# Patient Record
Sex: Female | Born: 1980 | Hispanic: Yes | Marital: Single | Smoking: Never smoker
Health system: Southern US, Community
[De-identification: ages and names within clinical notes are randomized; demographics above are authoritative.]

## PROBLEM LIST (undated history)

## (undated) ENCOUNTER — Inpatient Hospital Stay (HOSPITAL_COMMUNITY): Payer: Self-pay

## (undated) DIAGNOSIS — Z8759 Personal history of other complications of pregnancy, childbirth and the puerperium: Secondary | ICD-10-CM

## (undated) DIAGNOSIS — N189 Chronic kidney disease, unspecified: Secondary | ICD-10-CM

## (undated) DIAGNOSIS — F32A Depression, unspecified: Secondary | ICD-10-CM

## (undated) DIAGNOSIS — Z87440 Personal history of urinary (tract) infections: Secondary | ICD-10-CM

## (undated) DIAGNOSIS — F329 Major depressive disorder, single episode, unspecified: Secondary | ICD-10-CM

## (undated) HISTORY — PX: BREAST SURGERY: SHX581

---

## 2005-03-25 HISTORY — PX: BREAST LUMPECTOMY: SHX2

## 2017-01-08 ENCOUNTER — Encounter (HOSPITAL_COMMUNITY): Payer: Self-pay | Admitting: *Deleted

## 2017-01-08 ENCOUNTER — Emergency Department (HOSPITAL_COMMUNITY)
Admission: EM | Admit: 2017-01-08 | Discharge: 2017-01-08 | Disposition: A | Discharge status: Home / Self Care | Payer: Self-pay | Attending: Emergency Medicine | Admitting: Emergency Medicine

## 2017-01-08 DIAGNOSIS — H9201 Otalgia, right ear: Secondary | ICD-10-CM | POA: Insufficient documentation

## 2017-01-08 DIAGNOSIS — J3489 Other specified disorders of nose and nasal sinuses: Secondary | ICD-10-CM | POA: Insufficient documentation

## 2017-01-08 MED ORDER — FLUTICASONE PROPIONATE 50 MCG/ACT NA SUSP: 2.0000 | Freq: Every day | NASAL | 0 refills | Status: DC

## 2017-01-08 MED ORDER — AMOXICILLIN-POT CLAVULANATE 875-125 MG PO TABS: 1.0000 | ORAL_TABLET | Freq: Two times a day (BID) | ORAL | 0 refills | Status: AC

## 2017-01-08 MED ORDER — AMOXICILLIN-POT CLAVULANATE 875-125 MG PO TABS
1.0000 | ORAL_TABLET | Freq: Once | ORAL | Status: AC
  Administered 2017-01-08: 1 via ORAL
  Filled 2017-01-08: qty 1

## 2017-01-08 NOTE — ED Provider Notes (Signed)
[facility] EMERGENCY DEPARTMENT Provider Note   CSN: [id number] Arrival date & time: 01/08/17  1324     History   Chief Complaint Chief Complaint  Patient presents with  . Fever  . Otalgia    HPI Melanie Warner is a 36 y.o. female who presents today with chief complaint acute onset, progressively worsening right-sided ear pain, myalgias, nasal congestion, frontal headache for 2 weeks. She states that her symptoms initially began with a sore throat which resolved, followed by right sided ear pain and aching frontal headache which is worse first thing in the mornings.also endorses subjective fevers and chills, bilateral tinnitus, and right-sided hearing loss. He denies CP, SOB, abdominal pain, nausea, or vomiting. Denies neck stiffness or pain. Has tried Advil without significant relief of her symptoms. She states that one day last week her left ear was draining yellow-red drainage. She does use Q-tips but is unsure if her symptoms began after using Q-tips recently.  The history is provided by the patient.    History reviewed. No pertinent past medical history.  There are no active problems to display for this patient.   History reviewed. No pertinent surgical history.  OB History    No data available       Home Medications    Prior to Admission medications   Medication Sig Start Date End Date Taking? Authorizing Provider  amoxicillin-clavulanate (AUGMENTIN) 875-125 MG tablet Take 1 tablet by mouth every 12 (twelve) hours. 01/08/17 01/18/17  Michela Pitcher A, PA-C  fluticasone (FLONASE) 50 MCG/ACT nasal spray Place 2 sprays into both nostrils daily. 01/08/17   Jeanie Sewer, PA-C    Family History History reviewed. No pertinent family history.  Social History Social History  Substance Use Topics  . Smoking status: Never Smoker  . Smokeless tobacco: Not on file  . Alcohol use No     Allergies   Patient has no known allergies.   Review  of Systems Review of Systems  Constitutional: Positive for chills and fever.  HENT: Positive for congestion, ear discharge, ear pain, hearing loss and sore throat. Negative for facial swelling and sneezing.   Respiratory: Negative for cough and shortness of breath.   Cardiovascular: Negative for chest pain.  Gastrointestinal: Negative for abdominal pain, nausea and vomiting.  Musculoskeletal: Negative for back pain and neck pain.  Neurological: Positive for headaches. Negative for syncope and weakness.     Physical Exam Updated Vital Signs BP 100/69 (BP Location: Left Arm)   Pulse 87   Temp 97.6 F (36.4 C) (Oral)   Resp 18   LMP 01/05/2017   SpO2 100%   Physical Exam  Constitutional: She appears well-developed and well-nourished. No distress.  HENT:  Head: Normocephalic and atraumatic.  Right Ear: External ear normal.  Left Ear: External ear normal.  No pain on touching the pinna or tragus of either ear. No mastoid tenderness bilaterally. LEft TM without erythema or bulging. Right TM with small amount of blood inferiorly behind the tympanic membrane. There appears to be a healing hematoma on the TM centrally. Nasal septum is midline with mild nasal mucosa bilaterally. No maxillary sinus TTP, bilateral frontal sinus tenderness to palpation. Posterior oropharynx without uvular deviation, tonsillar hypertrophy, erythema, or exudates. No trismus or sublingual abnormalities.  Eyes: Pupils are equal, round, and reactive to light. Conjunctivae and EOM are normal. Right eye exhibits no discharge. Left eye exhibits no discharge.  Neck: Normal range of motion. Neck supple. No JVD present. No tracheal  deviation present.  No meningeal signs  Cardiovascular: Normal rate, regular rhythm and normal heart sounds.   Pulmonary/Chest: Effort normal and breath sounds normal. No respiratory distress. She has no wheezes. She has no rales.  Abdominal: Soft. Bowel sounds are normal. She exhibits no  distension. There is no tenderness.  Musculoskeletal: She exhibits no edema.  Neurological: She is alert.  Skin: Skin is warm and dry. No erythema.  Psychiatric: She has a normal mood and affect. Her behavior is normal.  Nursing note and vitals reviewed.    ED Treatments / Results  Labs (all labs ordered are listed, but only abnormal results are displayed) Labs Reviewed - No data to display  EKG  EKG Interpretation None       Radiology No results found.  Procedures Procedures (including critical care time)  Medications Ordered in ED Medications  amoxicillin-clavulanate (AUGMENTIN) 875-125 MG per tablet 1 tablet (1 tablet Oral Given 01/08/17 1409)     Initial Impression / Assessment and Plan / ED Course  I have reviewed the triage vital signs and the nursing notes.  Pertinent labs & imaging results that were available during my care of the patient were reviewed by me and considered in my medical decision making (see chart for details).     Patient with history and physical examination suggestive of right acute otitis media versus frontal sinusitis. Afebrile, vital signs are stable while in the ED. Examination is not concerning for strep pharyngitis, PTA, soft tissue infection, Ludwig's angina, pneumonia, or other acute cardio pulmonary abnormality. No evidence of mastoiditis. She is nontoxic in appearance. Advised patient she should stop using Q-tips to clean her ears. Will discharge with Augmentin and Flonase. First dose of Augmentin given in the ED. Recommend follow-up with primary care physician in the next 3-4 days for reevaluation. Discussed indications for return to the ED. Pt verbalized understanding of and agreement with plan and is safe for discharge home at this time.   Final Clinical Impressions(s) / ED Diagnoses   Final diagnoses:  Acute ear pain, right  Frontal sinus pain    New Prescriptions Discharge Medication List as of 01/08/2017  2:02 PM    START  taking these medications   Details  amoxicillin-clavulanate (AUGMENTIN) 875-125 MG tablet Take 1 tablet by mouth every 12 (twelve) hours., Starting Wed 01/08/2017, Until Sat 01/18/2017, Print    fluticasone (FLONASE) 50 MCG/ACT nasal spray Place 2 sprays into both nostrils daily., Starting Wed 01/08/2017, Print         Luevenia MaxinFawze, [place] A, PA-C 01/08/17 1425    Gerhard MunchLockwood, Robert, MD 01/09/17 1210

## 2017-01-08 NOTE — Discharge Instructions (Signed)
Please take all of your antibiotics until finished!   You may develop abdominal discomfort or diarrhea from the antibiotic.  You may help offset this with probiotics which you can buy or get in yogurt. Do not eat  or take the probiotics until 2 hours after your antibiotic.   Use Flonase for nasal congestion. You may continue taking allergy medications for your symptoms. Alternate 600 mg of ibuprofen and [phone] mg of Tylenol every 3 hours as needed for pain. Do not exceed 4000 mg of Tylenol daily. Follow-up with primary care physician in the next 3-4 days for reevaluation if symptoms persist. Return to the ED if any concerning signs or symptoms develop.

## 2017-01-08 NOTE — ED Triage Notes (Signed)
Pt reports bodyaches, fever and right ear pain x several days. No acute distress is noted at triage.

## 2017-04-22 ENCOUNTER — Encounter (HOSPITAL_COMMUNITY): Payer: Self-pay | Admitting: Emergency Medicine

## 2017-04-22 ENCOUNTER — Emergency Department (HOSPITAL_COMMUNITY)
Admission: EM | Admit: 2017-04-22 | Discharge: 2017-04-23 | Disposition: A | Discharge status: Home / Self Care | Payer: Self-pay | Attending: Emergency Medicine | Admitting: Emergency Medicine

## 2017-04-22 ENCOUNTER — Other Ambulatory Visit: Payer: Self-pay

## 2017-04-22 DIAGNOSIS — F329 Major depressive disorder, single episode, unspecified: Secondary | ICD-10-CM

## 2017-04-22 DIAGNOSIS — Z3A00 Weeks of gestation of pregnancy not specified: Secondary | ICD-10-CM | POA: Insufficient documentation

## 2017-04-22 DIAGNOSIS — O219 Vomiting of pregnancy, unspecified: Secondary | ICD-10-CM | POA: Insufficient documentation

## 2017-04-22 DIAGNOSIS — O99341 Other mental disorders complicating pregnancy, first trimester: Secondary | ICD-10-CM

## 2017-04-22 DIAGNOSIS — F419 Anxiety disorder, unspecified: Secondary | ICD-10-CM | POA: Insufficient documentation

## 2017-04-22 DIAGNOSIS — Z79899 Other long term (current) drug therapy: Secondary | ICD-10-CM | POA: Insufficient documentation

## 2017-04-22 DIAGNOSIS — O99344 Other mental disorders complicating childbirth: Secondary | ICD-10-CM | POA: Insufficient documentation

## 2017-04-22 LAB — BASIC METABOLIC PANEL
Anion gap: 9 (ref 5–15)
BUN: 11 mg/dL (ref 6–20)
CHLORIDE: 106 mmol/L (ref 101–111)
CO2: 21 mmol/L — ABNORMAL LOW (ref 22–32)
CREATININE: 0.69 mg/dL (ref 0.44–1.00)
Calcium: 9.1 mg/dL (ref 8.9–10.3)
GFR calc Af Amer: >60 mL/min (ref >60)
GFR calc non Af Amer: >60 mL/min (ref >60)
GLUCOSE: 94 mg/dL (ref 65–99)
POTASSIUM: 3.5 mmol/L (ref 3.5–5.1)
SODIUM: 136 mmol/L (ref 135–145)

## 2017-04-22 LAB — CBC
HCT: 39.2 % (ref 36.0–46.0)
HEMOGLOBIN: 13.4 g/dL (ref 12.0–15.0)
MCH: 30.2 pg (ref 26.0–34.0)
MCHC: 34.2 g/dL (ref 30.0–36.0)
MCV: 88.3 fL (ref 78.0–100.0)
Platelets: 257 10*3/uL (ref 150–400)
RBC: 4.44 MIL/uL (ref 3.87–5.11)
RDW: 13.4 % (ref 11.5–15.5)
WBC: 8.5 10*3/uL (ref 4.0–10.5)

## 2017-04-22 LAB — URINALYSIS, ROUTINE W REFLEX MICROSCOPIC
Bilirubin Urine: NEGATIVE
GLUCOSE, UA: NEGATIVE mg/dL
HGB URINE DIPSTICK: NEGATIVE
Ketones, ur: NEGATIVE mg/dL
LEUKOCYTES UA: NEGATIVE
Nitrite: NEGATIVE
PH: 6 (ref 5.0–8.0)
Protein, ur: NEGATIVE mg/dL
SPECIFIC GRAVITY, URINE: 1.015 (ref 1.005–1.030)

## 2017-04-22 LAB — HCG, QUANTITATIVE, PREGNANCY: hCG, Beta Chain, Quant, S: 46930 m[IU]/mL — ABNORMAL HIGH (ref <5)

## 2017-04-22 MED ORDER — ONDANSETRON HCL 4 MG PO TABS: 4.0000 mg | ORAL_TABLET | Freq: Three times a day (TID) | ORAL | 0 refills | Status: DC | PRN

## 2017-04-22 MED ORDER — ONDANSETRON 4 MG PO TBDP
4.0000 mg | ORAL_TABLET | Freq: Once | ORAL | Status: AC
  Administered 2017-04-22: 4 mg via ORAL
  Filled 2017-04-22: qty 1

## 2017-04-22 NOTE — Discharge Instructions (Signed)
Use Zofran as needed for nausea or vomiting. Eat small, bland meals throughout the day.  Is important that you are staying well-hydrated and eating well. There is information about outpatient counseling services in the area.  Follow-up regarding your depression and anxiety.  Return to the emergency room if you develop persistent vomiting despite medication, suicidal thoughts, or any new or worsening symptoms.

## 2017-04-22 NOTE — ED Triage Notes (Signed)
Pt reports she recently found out she was pregnant, LMP 03/08/17. Pt has been having panic attacks over the past few days, pt tearful in triage. Denies abd pain, vaginal bleeding/DC

## 2017-04-23 NOTE — ED Provider Notes (Signed)
[facility] Provider Note   CSN: [id number] Arrival date & time: 04/22/17  2001     History   Chief Complaint Chief Complaint  Patient presents with  . Panic Attack    HPI Melanie Warner is a 37 y.o. female presenting for evaluation of anxiety.  Patient states that she found out she was pregnant several weeks ago.  Since then, she has had worsening anxiety and depression.  She reports nausea, vomited once on Sunday.  She states she has had increased tiredness, and has become very emotional.  She has a history of depression, for which she received therapy several years ago.  She has never been on medicine for this.  She denies SI, HI, or AVH.  She reports her appetite has been poor due to nausea.  This is her second child, she denies similar symptoms with her previous pregnancy.  She denies fevers, chills, chest pain, shortness of breath, abdominal pain, urinary symptoms, vaginal bleeding, or abnormal bowel movements.  She has no other medical problems, does not take medications daily.  HPI  History reviewed. No pertinent past medical history.  There are no active problems to display for this patient.   History reviewed. No pertinent surgical history.  OB History    Gravida Para Term Preterm AB Living   1             SAB TAB Ectopic Multiple Live Births                   Home Medications    Prior to Admission medications   Medication Sig Start Date End Date Taking? Authorizing Provider  Prenatal Vit-Fe Fumarate-FA (PRENATAL MULTIVITAMIN) TABS tablet Take 1 tablet by mouth daily.   Yes [provider]  fluticasone (FLONASE) 50 MCG/ACT nasal spray Place 2 sprays into both nostrils daily. Patient not taking: Reported on 04/22/2017 01/08/17   Michela PitcherFawze, Mina A, PA-C  ondansetron (ZOFRAN) 4 MG tablet Take 1 tablet (4 mg total) by mouth every 8 (eight) hours as needed for nausea or vomiting. 04/22/17   Tommey Barret, PA-C      Family History No family history on file.  Social History Social History   Tobacco Use  . Smoking status: Never Smoker  . Smokeless tobacco: Never Used  Substance Use Topics  . Alcohol use: No  . Drug use: No     Allergies   Patient has no known allergies.   Review of Systems Review of Systems  Constitutional: Positive for appetite change. Negative for chills and fever.  Respiratory: Negative for shortness of breath.   Cardiovascular: Negative for chest pain.  Gastrointestinal: Positive for nausea and vomiting. Negative for abdominal pain.  Genitourinary: Negative for dysuria, frequency, hematuria, vaginal bleeding, vaginal discharge and vaginal pain.  Musculoskeletal: Negative for back pain and myalgias.  Skin: Negative for rash.  Allergic/Immunologic: Negative for immunocompromised state.  Neurological: Negative for dizziness and headaches.  Hematological: Does not bruise/bleed easily.  Psychiatric/Behavioral: Positive for dysphoric mood. Negative for suicidal ideas. The patient is nervous/anxious.      Physical Exam Updated Vital Signs BP 105/63 (BP Location: Left Arm)   Pulse 69   Temp 98.3 F (36.8 C) (Oral)   Resp 16   Ht 5\' 5"  (1.651 m)   Wt 72.6 kg (160 lb)   LMP 03/08/2017 (Exact Date)   SpO2 100%   BMI 26.63 kg/m   Physical Exam  Constitutional: She is oriented to person, place, and  time. She appears well-developed and well-nourished. No distress.  HENT:  Head: Normocephalic and atraumatic.  Eyes: Conjunctivae and EOM are normal. Pupils are equal, round, and reactive to light.  Neck: Normal range of motion.  Cardiovascular: Normal rate, regular rhythm and intact distal pulses.  Pulmonary/Chest: Effort normal and breath sounds normal. No respiratory distress. She has no wheezes.  Abdominal: Soft. She exhibits no distension and no mass. There is no tenderness. There is no guarding.  No tenderness palpation of the abdomen.  No rigidity, guarding,  or distention.  Musculoskeletal: Normal range of motion.  Neurological: She is alert and oriented to person, place, and time.  Skin: Skin is warm and dry.  Psychiatric: She has a normal mood and affect. Her speech is normal and behavior is normal. Thought content normal. She expresses no homicidal and no suicidal ideation. She expresses no suicidal plans and no homicidal plans.  Patient calm and cooperative.  Became teary at one point during history.  Nursing note and vitals reviewed.    ED Treatments / Results  Labs (all labs ordered are listed, but only abnormal results are displayed) Labs Reviewed  BASIC METABOLIC PANEL - Abnormal; Notable for the following components:      Result Value   CO2 21 (*)    All other components within normal limits  HCG, QUANTITATIVE, PREGNANCY - Abnormal; Notable for the following components:   hCG, Beta Chain, Quant, S 46,930 (*)    All other components within normal limits  CBC  URINALYSIS, ROUTINE W REFLEX MICROSCOPIC    EKG  EKG Interpretation None       Radiology No results found.  Procedures Procedures (including critical care time)  Medications Ordered in ED Medications  ondansetron (ZOFRAN-ODT) disintegrating tablet 4 mg (4 mg Oral Given 04/22/17 2322)     Initial Impression / Assessment and Plan / ED Course  I have reviewed the triage vital signs and the nursing notes.  Pertinent labs & imaging results that were available during my care of the patient were reviewed by me and considered in my medical decision making (see chart for details).     Patient presenting for evaluation of anxiety, depression, and nausea.  This began when she found that she was pregnant several weeks ago.  Labs reassuring, no sign of UTI.  She is not dehydrated.  No leukocytosis.  Pregnancy positive.  Will treat nausea with Zofran and p.o. challenge.  Discussed follow-up with outpatient therapy for anxiety and depression. No SI, HI, or AVH. I do not  believe TTS is needed. Discussed hormonal changes in pregnancy.  Discussed pregnancy is different with each child.  On reassessment, patient reports she feels much improved with Zofran.  Prescription given.  Outpatient counseling services information given.  Instructed to return if she has persistent vomiting, SI, HI, or AVH.  Given information about crisis centers.  At this time, patient appears safe for discharge.  Return precautions given.  Patient states she understands and agrees to plan.  Final Clinical Impressions(s) / ED Diagnoses   Final diagnoses:  Nausea and vomiting during pregnancy  Anxiety  Depression during pregnancy in first trimester    ED Discharge Orders        Ordered    ondansetron (ZOFRAN) 4 MG tablet  Every 8 hours PRN     04/22/17 2347       Alveria Apley, PA-C 04/23/17 0102    Linwood Dibbles, MD 04/23/17 [phone]

## 2017-05-01 ENCOUNTER — Ambulatory Visit: Payer: Self-pay

## 2017-05-06 ENCOUNTER — Ambulatory Visit (INDEPENDENT_AMBULATORY_CARE_PROVIDER_SITE_OTHER): Payer: Self-pay | Admitting: Clinical

## 2017-05-06 ENCOUNTER — Ambulatory Visit: Payer: Self-pay | Admitting: *Deleted

## 2017-05-06 ENCOUNTER — Encounter: Payer: Self-pay | Admitting: General Practice

## 2017-05-06 DIAGNOSIS — F329 Major depressive disorder, single episode, unspecified: Secondary | ICD-10-CM

## 2017-05-06 DIAGNOSIS — Z3401 Encounter for supervision of normal first pregnancy, first trimester: Secondary | ICD-10-CM

## 2017-05-06 DIAGNOSIS — F32A Depression, unspecified: Secondary | ICD-10-CM

## 2017-05-06 DIAGNOSIS — F321 Major depressive disorder, single episode, moderate: Secondary | ICD-10-CM

## 2017-05-06 DIAGNOSIS — Z3201 Encounter for pregnancy test, result positive: Secondary | ICD-10-CM

## 2017-05-06 DIAGNOSIS — O99341 Other mental disorders complicating pregnancy, first trimester: Secondary | ICD-10-CM

## 2017-05-06 LAB — POCT PREGNANCY, URINE: PREG TEST UR: POSITIVE — AB

## 2017-05-06 MED ORDER — SERTRALINE HCL 25 MG PO TABS: 25.0000 mg | ORAL_TABLET | Freq: Every day | ORAL | 3 refills | Status: DC

## 2017-05-06 NOTE — Progress Notes (Signed)
I have reviewed the chart and agree with nursing staff's documentation of this patient's encounter.  [facility]. Zoloft prescribed for patient, [facility]   Jaynie CollinsUgonna Tenelle Andreason, MD 05/06/2017 11:23 AM

## 2017-05-06 NOTE — Addendum Note (Signed)
Addended by: Jaynie CollinsANYANWU, Calib Wadhwa A on: 05/06/2017 11:24 AM   Modules accepted: Orders

## 2017-05-06 NOTE — BH Specialist Note (Signed)
Integrated Behavioral Health Initial Visit  MRN: [id number] Name: Melanie Warner  Number of Integrated Behavioral Health Clinician visits:: 1/6 Session Start time: 11:06  Session End time: 11:31 Total time: 30 minutes  Type of Service: Integrated Behavioral Health- Individual/Family Interpretor:No. Interpretor Name and Language: n/a   Warm Hand Off Completed.       SUBJECTIVE: Melanie GlennMonica Warner is a 37 y.o. female accompanied by Partner/Significant Other Patient was referred by Dr Macon LargeAnyanwu for depression and anxiety. Patient reports the following symptoms/concerns: Pt states her primary concern today is daily symptoms of feeling depressed, fatigue, lack of appetite, lack of motivation, difficulty getting out of bed, excessive crying, worry at night preventing sleep, and recent panic attacks.  Duration of problem: Less than 2 months; Severity of problem: moderate  OBJECTIVE: Mood: Anxious and Depressed and Affect: Depressed and Tearful Risk of harm to self or others: No plan to harm self or others  LIFE CONTEXT: Family and Social: Pt lives with FOB and 3yo daughter School/Work: - Self-Care: - Life Changes: Current pregnancy  GOALS ADDRESSED: Patient will: 1. Reduce symptoms of: anxiety and depression 2. Increase knowledge and/or ability of: coping skills  3. Demonstrate ability to: Increase healthy adjustment to current life circumstances  INTERVENTIONS: Interventions utilized: Sleep Hygiene and Psychoeducation and/or Health Education  Standardized Assessments completed: Not Needed  ASSESSMENT: Patient currently experiencing Major depressive disorder, single episode   Patient may benefit from psychoeducation and brief therapeutic interventions regarding coping with symptoms of anxiety with panic attacks and depression .  PLAN: 1. Follow up with behavioral health clinician on : Two days phone f/u; one week in office  visit 2. Behavioral recommendations:   -Pick up BH medication today and begin taking, as prescribed by medical provider -Use sleep app tonight at bedtime -Read educational material regarding coping with symptoms of anxiety with panic attacks and depression 3. Referral(s[facility] 4. "From scale of 1-10, how likely are you to follow plan?": 9  Lekeshia Kram C Sinclair Alligood, LCSW

## 2017-05-06 NOTE — Progress Notes (Signed)
Pt came in the office for Pregnancy test--positive , 8wk2 days, Due date --sept 21, LMP--03/08/17. Pt stated having anxiety, possible depression symptoms--seeing Jamie today. Also, wanted to start her prenatal care here in the office and advise her to start prenatal vitamins

## 2017-06-05 ENCOUNTER — Ambulatory Visit (INDEPENDENT_AMBULATORY_CARE_PROVIDER_SITE_OTHER): Payer: Self-pay | Admitting: Student

## 2017-06-05 ENCOUNTER — Encounter: Payer: Self-pay | Admitting: Student

## 2017-06-05 VITALS — BP 105/66 | HR 84 | Wt 135.1 lb

## 2017-06-05 DIAGNOSIS — N63 Unspecified lump in unspecified breast: Secondary | ICD-10-CM

## 2017-06-05 DIAGNOSIS — Z124 Encounter for screening for malignant neoplasm of cervix: Secondary | ICD-10-CM

## 2017-06-05 DIAGNOSIS — Z1151 Encounter for screening for human papillomavirus (HPV): Secondary | ICD-10-CM

## 2017-06-05 DIAGNOSIS — Z113 Encounter for screening for infections with a predominantly sexual mode of transmission: Secondary | ICD-10-CM

## 2017-06-05 DIAGNOSIS — Z3481 Encounter for supervision of other normal pregnancy, first trimester: Secondary | ICD-10-CM

## 2017-06-05 DIAGNOSIS — Z3480 Encounter for supervision of other normal pregnancy, unspecified trimester: Secondary | ICD-10-CM

## 2017-06-05 LAB — POCT URINALYSIS DIP (DEVICE)
Bilirubin Urine: NEGATIVE
Glucose, UA: NEGATIVE mg/dL
Hgb urine dipstick: NEGATIVE
Ketones, ur: NEGATIVE mg/dL
Leukocytes, UA: NEGATIVE
Nitrite: NEGATIVE
Protein, ur: NEGATIVE mg/dL
Specific Gravity, Urine: >=1.03 (ref 1.005–1.030)
Urobilinogen, UA: 0.2 mg/dL (ref 0.0–1.0)
pH: 7 (ref 5.0–8.0)

## 2017-06-05 NOTE — Patient Instructions (Signed)
-No chocolate, caffeine, Tea or cola for 5 weeks -Return in 4 weeks for repeat breast exam in 4 weeks as part of NOB.   How a Baby Grows During Pregnancy Pregnancy begins when a female's sperm enters a female's egg (fertilization). This happens in one of the tubes (fallopian tubes) that connect the ovaries to the womb (uterus). The fertilized egg is called an embryo until it reaches 10 weeks. From 10 weeks until birth, it is called a fetus. The fertilized egg moves down the fallopian tube to the uterus. Then it implants into the lining of the uterus and begins to grow. The developing fetus receives oxygen and nutrients through the pregnant woman's bloodstream and the tissues that grow (placenta) to support the fetus. The placenta is the life support system for the fetus. It provides nutrition and removes waste. Learning as much as you can about your pregnancy and how your baby is developing can help you enjoy the experience. It can also make you aware of when there might be a problem and when to ask questions. How long does a typical pregnancy last? A pregnancy usually lasts 280 days, or about 40 weeks. Pregnancy is divided into three trimesters:  First trimester: 0-13 weeks.  Second trimester: 14-27 weeks.  Third trimester: 28-40 weeks.  The day when your baby is considered ready to be born (full term) is your estimated date of delivery. How does my baby develop month by month? First month  The fertilized egg attaches to the inside of the uterus.  Some cells will form the placenta. Others will form the fetus.  The arms, legs, brain, spinal cord, lungs, and heart begin to develop.  At the end of the first month, the heart begins to beat.  Second month  The bones, inner ear, eyelids, hands, and feet form.  The genitals develop.  By the end of 8 weeks, all major organs are developing.  Third month  All of the internal organs are forming.  Teeth develop below the gums.  Bones and  muscles begin to grow. The spine can flex.  The skin is transparent.  Fingernails and toenails begin to form.  Arms and legs continue to grow longer, and hands and feet develop.  The fetus is about 3 in (7.6 cm) long.  Fourth month  The placenta is completely formed.  The external sex organs, neck, outer ear, eyebrows, eyelids, and fingernails are formed.  The fetus can hear, swallow, and move its arms and legs.  The kidneys begin to produce urine.  The skin is covered with a white waxy coating (vernix) and very fine hair (lanugo).  Fifth month  The fetus moves around more and can be felt for the first time (quickening).  The fetus starts to sleep and wake up and may begin to suck its finger.  The nails grow to the end of the fingers.  The organ in the digestive system that makes bile (gallbladder) functions and helps to digest the nutrients.  If your baby is a girl, eggs are present in her ovaries. If your baby is a boy, testicles start to move down into his scrotum.  Sixth month  The lungs are formed, but the fetus is not yet able to breathe.  The eyes open. The brain continues to develop.  Your baby has fingerprints and toe prints. Your baby's hair grows thicker.  At the end of the second trimester, the fetus is about 9 in (22.9 cm) long.  Seventh month  The fetus kicks and stretches.  The eyes are developed enough to sense changes in light.  The hands can make a grasping motion.  The fetus responds to sound.  Eighth month  All organs and body systems are fully developed and functioning.  Bones harden and taste buds develop. The fetus may hiccup.  Certain areas of the brain are still developing. The skull remains soft.  Ninth month  The fetus gains about  lb (0.23 kg) each week.  The lungs are fully developed.  Patterns of sleep develop.  The fetus's head typically moves into a head-down position (vertex) in the uterus to prepare for birth. If  the buttocks move into a vertex position instead, the baby is breech.  The fetus weighs 6-9 lbs (2.72-4.08 kg) and is 19-20 in (48.26-50.8 cm) long.  What can I do to have a healthy pregnancy and help my baby develop? Eating and Drinking  Eat a healthy diet. ? Talk with your health care provider to make sure that you are getting the nutrients that you and your baby need. ? Visit www.[employer].bechoosemyplate.gov to learn about creating a healthy diet.  Gain a healthy amount of weight during pregnancy as advised by your health care provider. This is usually 25-35 pounds. You may need to: ? Gain more if you were underweight before getting pregnant or if you are pregnant with more than one baby. ? Gain less if you were overweight or obese when you got pregnant.  Medicines and Vitamins  Take prenatal vitamins as directed by your health care provider. These include vitamins such as folic acid, iron, calcium, and vitamin D. They are important for healthy development.  Take medicines only as directed by your health care provider. Read labels and ask a pharmacist or your health care provider whether over-the-counter medicines, supplements, and prescription drugs are safe to take during pregnancy.  Activities  Be physically active as advised by your health care provider. Ask your health care provider to recommend activities that are safe for you to do, such as walking or swimming.  Do not participate in strenuous or extreme sports.  Lifestyle  Do not drink alcohol.  Do not use any tobacco products, including cigarettes, chewing tobacco, or electronic cigarettes. If you need help quitting, ask your health care provider.  Do not use illegal drugs.  Safety  Avoid exposure to mercury, lead, or other heavy metals. Ask your health care provider about common sources of these heavy metals.  Avoid listeria infection during pregnancy. Follow these precautions: ? Do not eat soft cheeses or deli meats. ? Do  not eat hot dogs unless they have been warmed up to the point of steaming, such as in the microwave oven. ? Do not drink unpasteurized milk.  Avoid toxoplasmosis infection during pregnancy. Follow these precautions: ? Do not change your cat's litter box, if you have a cat. Ask someone else to do this for you. ? Wear gardening gloves while working in the yard.  General Instructions  Keep all follow-up visits as directed by your health care provider. This is important. This includes prenatal care and screening tests.  Manage any chronic health conditions. Work closely with your health care provider to keep conditions, such as diabetes, under control.  How do I know if my baby is developing well? At each prenatal visit, your health care provider will do several different tests to check on your health and keep track of your baby's development. These include:  Fundal height. ? Your  health care provider will measure your growing belly from top to bottom using a tape measure. ? Your health care provider will also feel your belly to determine your baby's position.  Heartbeat. ? An ultrasound in the first trimester can confirm pregnancy and show a heartbeat, depending on how far along you are. ? Your health care provider will check your baby's heart rate at every prenatal visit. ? As you get closer to your delivery date, you may have regular fetal heart rate monitoring to make sure that your baby is not in distress.  Second trimester ultrasound. ? This ultrasound checks your baby's development. It also indicates your baby's gender.  What should I do if I have concerns about my baby's development? Always talk with your health care provider about any concerns that you may have. This information is not intended to replace advice given to you by your health care provider. Make sure you discuss any questions you have with your health care provider. Document Released: 08/28/2007 Document Revised:  08/17/2015 Document Reviewed: 08/18/2013 Elsevier Interactive Patient Education  [employer].

## 2017-06-06 DIAGNOSIS — N63 Unspecified lump in unspecified breast: Secondary | ICD-10-CM | POA: Insufficient documentation

## 2017-06-06 LAB — CYTOLOGY - PAP
CHLAMYDIA, DNA PROBE: NEGATIVE
DIAGNOSIS: NEGATIVE
HPV (WINDOPATH): NOT DETECTED
NEISSERIA GONORRHEA: NEGATIVE

## 2017-06-06 NOTE — Progress Notes (Signed)
  Subjective:    Melanie Warner is being seen today for her first obstetrical visit.  This is not a planned pregnancy. She is at 4471w6d gestation. Her obstetrical history is significant for breast lumps and lump removal. Biopsies have been negative for malignancy. Relationship with FOB: spouse, living together. Patient does intend to breast feed. Pregnancy history fully reviewed.  Patient reports no complaints.  Review of Systems:   Review of Systems  Constitutional: Negative.   HENT: Negative.   Respiratory: Negative.   Cardiovascular: Negative.   Genitourinary: Negative.   Musculoskeletal: Negative.   Neurological: Negative.   Hematological: Negative.     Objective:     BP 105/66   Pulse 84   Wt 135 lb 1.6 oz (61.3 kg)   LMP 03/08/2017 (Exact Date)   BMI 22.48 kg/m  Physical Exam  Constitutional: She appears well-developed.  HENT:  Head: Normocephalic.  Eyes: Pupils are equal, round, and reactive to light.  GI: Soft.  Musculoskeletal: Normal range of motion.  Neurological: She is alert.  Skin: Skin is warm and dry.  Psychiatric: She has a normal mood and affect.   Breast exam: Left breast has small pea sized lump on patient's upper left quadrant, consistent with scar tissue from former biopsy. Exam    Assessment:    Pregnancy: G2P1001 Patient Active Problem List   Diagnosis Date Noted  . Supervision of other normal pregnancy, antepartum 06/05/2017       Plan:     Initial labs drawn. Prenatal vitamins. Problem list reviewed and updated. Will do NIPS Role of ultrasound in pregnancy discussed; fetal survey: ordered. Amniocentesis discussed: not indicated. Follow up in 4 weeks. 50% of 30 min visit spent on counseling and coordination of care.  -Pap today -HgbA1c -oriented patient to practice -discussed breast lump with Dr. Erin FullingHarraway Smith, who recommends cessation of coffee, caffeine, tea and chocolate and reassess in one month with same provider.   -Patient amenable to plan  Charlesetta GaribaldiKathryn Lorraine [facility] 06/06/2017

## 2017-06-13 LAB — SMN1 COPY NUMBER ANALYSIS (SMA CARRIER SCREENING)

## 2017-06-13 LAB — HEMOGLOBINOPATHY EVALUATION
FERRITIN: 71 ng/mL (ref 15–150)
HGB C: 0 %
HGB F QUANT: 0 % (ref 0.0–2.0)
HGB SOLUBILITY: NEGATIVE
Hgb A2 Quant: 2.2 % (ref 1.8–3.2)
Hgb A: 97.8 % (ref 96.4–98.8)
Hgb S: 0 %
Hgb Variant: 0 %

## 2017-06-13 LAB — OBSTETRIC PANEL, INCLUDING HIV
ANTIBODY SCREEN: NEGATIVE
BASOS ABS: 0 10*3/uL (ref 0.0–0.2)
BASOS: 0 %
EOS (ABSOLUTE): 0.3 10*3/uL (ref 0.0–0.4)
Eos: 3 %
HEMATOCRIT: 38.4 % (ref 34.0–46.6)
HEP B S AG: NEGATIVE
HIV Screen 4th Generation wRfx: NONREACTIVE
Hemoglobin: 13 g/dL (ref 11.1–15.9)
Immature Grans (Abs): 0.1 10*3/uL (ref 0.0–0.1)
Immature Granulocytes: 1 %
LYMPHS: 24 %
Lymphocytes Absolute: 2.3 10*3/uL (ref 0.7–3.1)
MCH: 30 pg (ref 26.6–33.0)
MCHC: 33.9 g/dL (ref 31.5–35.7)
MCV: 89 fL (ref 79–97)
MONOCYTES: 5 %
Monocytes Absolute: 0.5 10*3/uL (ref 0.1–0.9)
Neutrophils Absolute: 6.2 10*3/uL (ref 1.4–7.0)
Neutrophils: 67 %
Platelets: 264 10*3/uL (ref 150–379)
RBC: 4.34 x10E6/uL (ref 3.77–5.28)
RDW: 13.7 % (ref 12.3–15.4)
RPR: NONREACTIVE
RUBELLA: 2.55 {index} (ref >0.99)
Rh Factor: POSITIVE
WBC: 9.3 10*3/uL (ref 3.4–10.8)

## 2017-06-13 LAB — CULTURE, OB URINE

## 2017-06-13 LAB — HEMOGLOBIN A1C
ESTIMATED AVERAGE GLUCOSE: 103 mg/dL
Hgb A1c MFr Bld: 5.2 % (ref 4.8–5.6)

## 2017-06-13 LAB — CYSTIC FIBROSIS GENE TEST

## 2017-06-13 LAB — URINE CULTURE, OB REFLEX

## 2017-06-23 ENCOUNTER — Encounter: Payer: Self-pay | Admitting: *Deleted

## 2017-07-03 ENCOUNTER — Ambulatory Visit (INDEPENDENT_AMBULATORY_CARE_PROVIDER_SITE_OTHER): Payer: Self-pay | Admitting: Student

## 2017-07-03 VITALS — BP 106/66 | HR 90 | Wt 139.6 lb

## 2017-07-03 DIAGNOSIS — N63 Unspecified lump in unspecified breast: Secondary | ICD-10-CM

## 2017-07-03 DIAGNOSIS — Z3482 Encounter for supervision of other normal pregnancy, second trimester: Secondary | ICD-10-CM

## 2017-07-03 DIAGNOSIS — Z3480 Encounter for supervision of other normal pregnancy, unspecified trimester: Secondary | ICD-10-CM

## 2017-07-03 NOTE — Progress Notes (Signed)
[facility] referral made for breast lump since patient is self pay.

## 2017-07-03 NOTE — Patient Instructions (Signed)

## 2017-07-03 NOTE — Progress Notes (Signed)
   PRENATAL VISIT NOTE  Subjective:  Melanie Warner is a 37 y.o. G2P1001 at 2838w5d being seen today for ongoing prenatal care.  She is currently monitored for the following issues for this low-risk pregnancy and has Supervision of other normal pregnancy, antepartum and Breast lump on their problem list.  Patient reports no complaints. NV has decreased.  Has followed instructions for fibroma treatment and patient states that it is still the same size.  Contractions: Not present. Vag. Bleeding: None.  Movement: Present. Denies leaking of fluid.   The following portions of the patient's history were reviewed and updated as appropriate: allergies, current medications, past family history, past medical history, past social history, past surgical history and problem list. Problem list updated.  Objective:   Vitals:   07/03/17 1347  BP: 106/66  Pulse: 90  Weight: 139 lb 9.6 oz (63.3 kg)    Fetal Status: Fetal Heart Rate (bpm): 145   Movement: Present     General:  Alert, oriented and cooperative. Patient is in no acute distress.  Skin: Skin is warm and dry. No rash noted.   Cardiovascular: Normal heart rate noted  Respiratory: Normal respiratory effort, no problems with respiration noted  Abdomen: Soft, gravid, appropriate for gestational age.  Pain/Pressure: Present     Pelvic: Cervical exam deferred        Extremities: Normal range of motion.     Mental Status: Normal mood and affect. Normal behavior. Normal judgment and thought content.   Assessment and Plan:  Pregnancy: G2P1001 at 3838w5d  1. Supervision of other normal pregnancy, antepartum -doing well, no complaints.  -interested in water birth and prenatal classes; information given.   2. Breast lump -[facility] -patient knows that they will call her with appt; she will call them if they don't call her in two weeks.   Preterm labor symptoms and general obstetric precautions  including but not limited to vaginal bleeding, contractions, leaking of fluid and fetal movement were reviewed in detail with the patient. Please refer to After Visit Summary for other counseling recommendations.  Return in about 1 month (around 07/31/2017).  Future Appointments  Date Time Provider Department Center  07/17/2017  1:15 PM [facility] US 4 [facility]    Marylene LandKathryn Lorraine Mathilde Mcwherter, [place]

## 2017-07-17 ENCOUNTER — Other Ambulatory Visit: Payer: Self-pay | Admitting: Student

## 2017-07-17 ENCOUNTER — Ambulatory Visit (HOSPITAL_COMMUNITY)
Admission: RE | Admit: 2017-07-17 | Discharge: 2017-07-17 | Disposition: A | Discharge status: Home / Self Care | Payer: Self-pay | Source: Ambulatory Visit | Attending: Student | Admitting: Student

## 2017-07-17 DIAGNOSIS — Z363 Encounter for antenatal screening for malformations: Secondary | ICD-10-CM | POA: Insufficient documentation

## 2017-07-17 DIAGNOSIS — Z3A18 18 weeks gestation of pregnancy: Secondary | ICD-10-CM | POA: Insufficient documentation

## 2017-07-17 DIAGNOSIS — Z3480 Encounter for supervision of other normal pregnancy, unspecified trimester: Secondary | ICD-10-CM

## 2017-07-27 ENCOUNTER — Inpatient Hospital Stay (HOSPITAL_COMMUNITY): Payer: Self-pay

## 2017-07-27 ENCOUNTER — Inpatient Hospital Stay (HOSPITAL_COMMUNITY)
Admission: AD | Admit: 2017-07-27 | Discharge: 2017-07-27 | Disposition: A | Discharge status: Home / Self Care | Payer: Self-pay | Source: Ambulatory Visit | Attending: Obstetrics & Gynecology | Admitting: Obstetrics & Gynecology

## 2017-07-27 ENCOUNTER — Encounter (HOSPITAL_COMMUNITY): Payer: Self-pay | Admitting: *Deleted

## 2017-07-27 DIAGNOSIS — R109 Unspecified abdominal pain: Secondary | ICD-10-CM | POA: Insufficient documentation

## 2017-07-27 DIAGNOSIS — N200 Calculus of kidney: Secondary | ICD-10-CM | POA: Insufficient documentation

## 2017-07-27 DIAGNOSIS — Z3A20 20 weeks gestation of pregnancy: Secondary | ICD-10-CM | POA: Insufficient documentation

## 2017-07-27 DIAGNOSIS — O26892 Other specified pregnancy related conditions, second trimester: Secondary | ICD-10-CM | POA: Insufficient documentation

## 2017-07-27 DIAGNOSIS — O26832 Pregnancy related renal disease, second trimester: Secondary | ICD-10-CM

## 2017-07-27 HISTORY — DX: Chronic kidney disease, unspecified: N18.9

## 2017-07-27 HISTORY — DX: Depression, unspecified: F32.A

## 2017-07-27 HISTORY — DX: Major depressive disorder, single episode, unspecified: F32.9

## 2017-07-27 LAB — CBC WITH DIFFERENTIAL/PLATELET
Basophils Absolute: 0 10*3/uL (ref 0.0–0.1)
Basophils Relative: 0 %
EOS PCT: 3 %
Eosinophils Absolute: 0.3 10*3/uL (ref 0.0–0.7)
HCT: 32.9 % — ABNORMAL LOW (ref 36.0–46.0)
Hemoglobin: 11.2 g/dL — ABNORMAL LOW (ref 12.0–15.0)
LYMPHS ABS: 2.5 10*3/uL (ref 0.7–4.0)
LYMPHS PCT: 26 %
MCH: 30 pg (ref 26.0–34.0)
MCHC: 34 g/dL (ref 30.0–36.0)
MCV: 88.2 fL (ref 78.0–100.0)
MONO ABS: 0.3 10*3/uL (ref 0.1–1.0)
Monocytes Relative: 4 %
Neutro Abs: 6.4 10*3/uL (ref 1.7–7.7)
Neutrophils Relative %: 67 %
PLATELETS: 219 10*3/uL (ref 150–400)
RBC: 3.73 MIL/uL — AB (ref 3.87–5.11)
RDW: 13.3 % (ref 11.5–15.5)
WBC: 9.5 10*3/uL (ref 4.0–10.5)

## 2017-07-27 LAB — URINALYSIS, ROUTINE W REFLEX MICROSCOPIC
Bilirubin Urine: NEGATIVE
Glucose, UA: NEGATIVE mg/dL
Ketones, ur: NEGATIVE mg/dL
Leukocytes, UA: NEGATIVE
NITRITE: NEGATIVE
Protein, ur: NEGATIVE mg/dL
SPECIFIC GRAVITY, URINE: 1.009 (ref 1.005–1.030)
pH: 7 (ref 5.0–8.0)

## 2017-07-27 MED ORDER — OXYCODONE-ACETAMINOPHEN 5-325 MG PO TABS: 2.0000 | ORAL_TABLET | Freq: Four times a day (QID) | ORAL | 0 refills | Status: DC | PRN

## 2017-07-27 NOTE — MAU Note (Signed)
Pt is reporting lower back pain that started this morning. About 45 minutes prior to arrival, she had sharp left sided flank pain that radiated to her side. Was constant for about 15 minute, but then it went away. Pt did not take anything for pain. Pt states it does not hurt at the moment. Pt denies urinary s/s. Pt denies vaginal bleeding or discharge. Reports fetal movement.

## 2017-07-27 NOTE — MAU Provider Note (Addendum)
History     CSN: [id number]  Arrival date and time: 07/27/17 4098   First Provider Initiated Contact with Patient 07/27/17 2023      Chief Complaint  Patient presents with  . Flank Pain   Melanie Warner is a 37 y.o. G2P1001 at [redacted]w[redacted]d who presents today with left flank pain. She states that it started this morning and has gradually gotten worse. She had a kidney infection with her last pregnancy. She denies any hx of kidney stones. She denies any VB or LOF. She denies any contractions. She reports that she has felt fetal movement.   Flank Pain  This is a new problem. The current episode started today. The problem occurs constantly. The problem has been gradually worsening since onset. The pain is present in the lumbar spine. The quality of the pain is described as aching. Radiates to: left side of abdomen. The pain is at a severity of 9/10. Pertinent negatives include no dysuria or fever. Risk factors include pregnancy. She has tried nothing for the symptoms.    Past Medical History:  Diagnosis Date  . Chronic kidney disease    kidney infection with first pregnancy  . Depression     Past Surgical History:  Procedure Laterality Date  . BREAST SURGERY      Family History  Problem Relation Age of Onset  . Hypertension Father     Social History   Tobacco Use  . Smoking status: Never Smoker  . Smokeless tobacco: Never Used  Substance Use Topics  . Alcohol use: No  . Drug use: No    Allergies: No Known Allergies  Medications Prior to Admission  Medication Sig Dispense Refill Last Dose  . Prenatal Multivit-Min-Fe-FA (PRENATAL VITAMINS PO) Take by mouth.   07/27/2017 at Unknown time  . sertraline (ZOLOFT) 25 MG tablet Take 1 tablet (25 mg total) by mouth daily. (Patient not taking: Reported on 06/05/2017) 30 tablet 3 Not Taking    Review of Systems  Constitutional: Negative for chills and fever.  Gastrointestinal: Negative for nausea and vomiting.  Genitourinary:  Positive for flank pain. Negative for dysuria, frequency, hematuria, vaginal bleeding and vaginal discharge.   Physical Exam   Blood pressure 99/70, pulse 82, temperature 97.8 F (36.6 C), temperature source Oral, resp. rate 16, height  (1.626 m), weight 147 lb (66.7 kg), last menstrual period 03/08/2017, SpO2 99 %, unknown if currently breastfeeding.  Physical Exam  Nursing note and vitals reviewed. Constitutional: She is oriented to person, place, and time. She appears well-developed and well-nourished. No distress.  HENT:  Head: Normocephalic.  Cardiovascular: Normal rate.  Respiratory: Effort normal.  GI: Soft. There is no tenderness. There is no rebound.  Genitourinary:  Genitourinary Comments: No CVA tenderness   Neurological: She is alert and oriented to person, place, and time.  Skin: Skin is warm and dry.  Psychiatric: She has a normal mood and affect.   FHT: 143 bpm  MAU Course  Procedures Results for orders placed or performed during the hospital encounter of 07/27/17 (from the past 24 hour(s))  Urinalysis, Routine w reflex microscopic     Status: Abnormal   Collection Time: 07/27/17  7:24 PM  Result Value Ref Range   Color, Urine STRAW (A) YELLOW   APPearance CLEAR CLEAR   Specific Gravity, Urine 1.009 1.005 - 1.030   pH 7.0 5.0 - 8.0   Glucose, UA NEGATIVE NEGATIVE mg/dL   Hgb urine dipstick MODERATE (A) NEGATIVE   Bilirubin Urine NEGATIVE  NEGATIVE   Ketones, ur NEGATIVE NEGATIVE mg/dL   Protein, ur NEGATIVE NEGATIVE mg/dL   Nitrite NEGATIVE NEGATIVE   Leukocytes, UA NEGATIVE NEGATIVE   RBC / HPF 11-20 0 - 5 RBC/hpf   WBC, UA 0-5 0 - 5 WBC/hpf   Bacteria, UA RARE (A) NONE SEEN   Squamous Epithelial / LPF 0-5 0 - 5   Mucus PRESENT   CBC with Differential/Platelet     Status: Abnormal   Collection Time: 07/27/17  8:49 PM  Result Value Ref Range   WBC 9.5 4.0 - 10.5 K/uL   RBC 3.73 (L) 3.87 - 5.11 MIL/uL   Hemoglobin 11.2 (L) 12.0 - 15.0 g/dL   HCT  16.1 (L) 09.6 - 46.0 %   MCV 88.2 78.0 - 100.0 fL   MCH 30.0 26.0 - 34.0 pg   MCHC 34.0 30.0 - 36.0 g/dL   RDW 04.5 40.9 - 81.1 %   Platelets 219 150 - 400 K/uL   Neutrophils Relative % 67 %   Neutro Abs 6.4 1.7 - 7.7 K/uL   Lymphocytes Relative 26 %   Lymphs Abs 2.5 0.7 - 4.0 K/uL   Monocytes Relative 4 %   Monocytes Absolute 0.3 0.1 - 1.0 K/uL   Eosinophils Relative 3 %   Eosinophils Absolute 0.3 0.0 - 0.7 K/uL   Basophils Relative 0 %   Basophils Absolute 0.0 0.0 - 0.1 K/uL   US Renal  Result Date: 07/27/2017 CLINICAL DATA:  Right flank pain. Hematuria. Twenty weeks pregnant. EXAM: RENAL / URINARY TRACT ULTRASOUND COMPLETE COMPARISON:  None. FINDINGS: Right Kidney: Length: 12.4. Echogenicity within normal limits. No mass or hydronephrosis visualized. 6.4 mm shadowing nonobstructive calculus in the upper pole of the right kidney. Left Kidney: Length: 11.9 cm. Echogenicity within normal limits. No mass or hydronephrosis visualized. 4.4 mm shadowing nonobstructive calculus in the upper pole of the left kidney. Bladder: Appears normal for degree of bladder distention. Bilateral ureteral jets seen. IMPRESSION: Bilateral nonobstructive nephrolithiasis. No evidence of hydronephrosis. Electronically Signed   By: Ted Mcalpine M.D.   On: 07/27/2017 21:51   MDM 2100 Care turned over to Cleone Slim, CNM [place] and lab work pending Thressa Sheller 8:47 PM 07/27/17   Results reviewed with patient and will have patient strain urine and increase fluids at home. Patient has appointment on Thursday in office.   Assessment and Plan   1. Pregnancy complicated by nephrolithiasis in second trimester, antepartum   2. [redacted] weeks gestation of pregnancy    -Discharge home in stable condition -Rx for limited number of percocet to have if pain worsens -Straining urine and increasing hydration discussed. Pain and bleeding precautions discussed -Patient advised to follow-up with [facility] as scheduled on  Thursday for prenatal care -Patient may return to MAU as needed or if her condition were to change or worsen  Rolm Bookbinder, CNM 07/27/17 10:02 PM

## 2017-07-27 NOTE — Discharge Instructions (Signed)

## 2017-07-31 ENCOUNTER — Encounter: Payer: Self-pay | Admitting: Student

## 2017-08-22 ENCOUNTER — Inpatient Hospital Stay (HOSPITAL_COMMUNITY): Payer: Self-pay

## 2017-08-22 ENCOUNTER — Encounter (HOSPITAL_COMMUNITY): Payer: Self-pay | Admitting: *Deleted

## 2017-08-22 ENCOUNTER — Other Ambulatory Visit: Payer: Self-pay

## 2017-08-22 ENCOUNTER — Inpatient Hospital Stay (HOSPITAL_COMMUNITY)
Admission: AD | Admit: 2017-08-22 | Discharge: 2017-08-24 | DRG: 833 | Disposition: A | Discharge status: Home / Self Care | Payer: Self-pay | Attending: Obstetrics and Gynecology | Admitting: Obstetrics and Gynecology

## 2017-08-22 DIAGNOSIS — O09529 Supervision of elderly multigravida, unspecified trimester: Secondary | ICD-10-CM

## 2017-08-22 DIAGNOSIS — B9620 Unspecified Escherichia coli [E. coli] as the cause of diseases classified elsewhere: Secondary | ICD-10-CM | POA: Diagnosis present

## 2017-08-22 DIAGNOSIS — Z3A23 23 weeks gestation of pregnancy: Secondary | ICD-10-CM

## 2017-08-22 DIAGNOSIS — O2302 Infections of kidney in pregnancy, second trimester: Principal | ICD-10-CM

## 2017-08-22 DIAGNOSIS — N200 Calculus of kidney: Secondary | ICD-10-CM

## 2017-08-22 DIAGNOSIS — R109 Unspecified abdominal pain: Secondary | ICD-10-CM

## 2017-08-22 HISTORY — DX: Personal history of other complications of pregnancy, childbirth and the puerperium: Z87.59

## 2017-08-22 HISTORY — DX: Personal history of urinary (tract) infections: Z87.59

## 2017-08-22 HISTORY — DX: Personal history of other complications of pregnancy, childbirth and the puerperium: Z87.440

## 2017-08-22 LAB — CBC WITH DIFFERENTIAL/PLATELET
Basophils Absolute: 0 10*3/uL (ref 0.0–0.1)
Basophils Relative: 0 %
EOS ABS: 0.2 10*3/uL (ref 0.0–0.7)
Eosinophils Relative: 1 %
HEMATOCRIT: 35.1 % — AB (ref 36.0–46.0)
HEMOGLOBIN: 12.2 g/dL (ref 12.0–15.0)
LYMPHS ABS: 1.7 10*3/uL (ref 0.7–4.0)
LYMPHS PCT: 14 %
MCH: 31.5 pg (ref 26.0–34.0)
MCHC: 34.8 g/dL (ref 30.0–36.0)
MCV: 90.7 fL (ref 78.0–100.0)
MONOS PCT: 5 %
Monocytes Absolute: 0.5 10*3/uL (ref 0.1–1.0)
NEUTROS PCT: 80 %
Neutro Abs: 9.6 10*3/uL — ABNORMAL HIGH (ref 1.7–7.7)
Platelets: 228 10*3/uL (ref 150–400)
RBC: 3.87 MIL/uL (ref 3.87–5.11)
RDW: 13.4 % (ref 11.5–15.5)
WBC: 12.1 10*3/uL — AB (ref 4.0–10.5)

## 2017-08-22 LAB — URINALYSIS, ROUTINE W REFLEX MICROSCOPIC
Bilirubin Urine: NEGATIVE
Glucose, UA: NEGATIVE mg/dL
KETONES UR: 5 mg/dL — AB
Nitrite: POSITIVE — AB
PH: 7 (ref 5.0–8.0)
Protein, ur: 100 mg/dL — AB
SPECIFIC GRAVITY, URINE: 1.013 (ref 1.005–1.030)

## 2017-08-22 LAB — BASIC METABOLIC PANEL
Anion gap: 10 (ref 5–15)
BUN: 9 mg/dL (ref 6–20)
CHLORIDE: 107 mmol/L (ref 101–111)
CO2: 18 mmol/L — AB (ref 22–32)
CREATININE: 0.39 mg/dL — AB (ref 0.44–1.00)
Calcium: 8.8 mg/dL — ABNORMAL LOW (ref 8.9–10.3)
GLUCOSE: 118 mg/dL — AB (ref 65–99)
POTASSIUM: 3.7 mmol/L (ref 3.5–5.1)
SODIUM: 135 mmol/L (ref 135–145)

## 2017-08-22 MED ORDER — OXYCODONE HCL 5 MG PO TABS
5.0000 mg | ORAL_TABLET | Freq: Four times a day (QID) | ORAL | Status: DC | PRN
  Administered 2017-08-22: 5 mg via ORAL
  Filled 2017-08-22: qty 1

## 2017-08-22 MED ORDER — PRENATAL MULTIVITAMIN CH
1.0000 | ORAL_TABLET | Freq: Every day | ORAL | Status: DC
Start: 2017-08-22 — End: 2017-08-24
  Administered 2017-08-22 – 2017-08-24 (×3): 1 via ORAL
  Filled 2017-08-22 (×3): qty 1

## 2017-08-22 MED ORDER — SODIUM CHLORIDE 0.9 % IV BOLUS
1000.0000 mL | Freq: Once | INTRAVENOUS | Status: AC
  Administered 2017-08-22: 1000 mL via INTRAVENOUS

## 2017-08-22 MED ORDER — SODIUM CHLORIDE 0.9 % IV SOLN
2.0000 g | Freq: Once | INTRAVENOUS | Status: AC
  Administered 2017-08-22: 2 g via INTRAVENOUS
  Filled 2017-08-22: qty 2

## 2017-08-22 MED ORDER — DOCUSATE SODIUM 100 MG PO CAPS
100.0000 mg | ORAL_CAPSULE | Freq: Two times a day (BID) | ORAL | Status: DC | PRN
  Administered 2017-08-22 – 2017-08-24 (×2): 100 mg via ORAL
  Filled 2017-08-22 (×2): qty 1

## 2017-08-22 MED ORDER — CALCIUM CARBONATE ANTACID 500 MG PO CHEW: 2.0000 | CHEWABLE_TABLET | ORAL | Status: DC | PRN

## 2017-08-22 MED ORDER — ACETAMINOPHEN 325 MG PO TABS
650.0000 mg | ORAL_TABLET | ORAL | Status: DC | PRN
  Administered 2017-08-23 – 2017-08-24 (×2): 650 mg via ORAL
  Filled 2017-08-22 (×2): qty 2

## 2017-08-22 MED ORDER — SODIUM CHLORIDE 0.9 % IV SOLN: INTRAVENOUS | Status: AC

## 2017-08-22 MED ORDER — ZOLPIDEM TARTRATE 5 MG PO TABS: 5.0000 mg | ORAL_TABLET | Freq: Every evening | ORAL | Status: DC | PRN

## 2017-08-22 MED ORDER — TAMSULOSIN HCL 0.4 MG PO CAPS
0.4000 mg | ORAL_CAPSULE | Freq: Every day | ORAL | Status: DC
  Administered 2017-08-22 – 2017-08-24 (×3): 0.4 mg via ORAL
  Filled 2017-08-22 (×4): qty 1

## 2017-08-22 MED ORDER — DOCUSATE SODIUM 100 MG PO CAPS: 100.0000 mg | ORAL_CAPSULE | Freq: Every day | ORAL | Status: DC

## 2017-08-22 NOTE — MAU Note (Signed)
Pt to U/S & then to room 309.

## 2017-08-22 NOTE — MAU Note (Signed)
Frequency, urgency and burning with urination, started a few days ago. Now starting to have pain inl ow back- rt side.  Denies fever.

## 2017-08-22 NOTE — MAU Provider Note (Signed)
History     CSN: [id number]  Arrival date and time: 08/22/17 4098  First Provider Initiated Contact with Patient 08/22/17 1023        Chief Complaint  Patient presents with  . Dysuria   HPI Melanie Warner is a 37 y.o. G2P1001 at [redacted]w[redacted]d who presents with dysuria and flank pain. Hx of pyelo in previous pregnancy and was diagnosed with bilateral kidney stones earlier this month.  Reports symptoms of dysuria & urinary frequency that started yesterday. Since this morning has had right flank pain since this morning. Rates pain 7/10. Has not treated symptoms. Denies fever/chills, n/v, hematuria. Positive fetal movement.   OB History    Gravida  2   Para  1   Term  1   Preterm      AB      Living  1     SAB      TAB      Ectopic      Multiple      Live Births  1           Past Medical History:  Diagnosis Date  . Chronic kidney disease    kidney infection with first pregnancy  . Depression     Past Surgical History:  Procedure Laterality Date  . BREAST SURGERY      Family History  Problem Relation Age of Onset  . Hypertension Father     Social History   Tobacco Use  . Smoking status: Never Smoker  . Smokeless tobacco: Never Used  Substance Use Topics  . Alcohol use: No  . Drug use: No    Allergies: No Known Allergies  Medications Prior to Admission  Medication Sig Dispense Refill Last Dose  . Prenatal Multivit-Min-Fe-FA (PRENATAL VITAMINS PO) Take 1 tablet by mouth daily.    08/21/2017 at Unknown time  . oxyCODONE-acetaminophen (PERCOCET/ROXICET) 5-325 MG tablet Take 2 tablets by mouth every 6 (six) hours as needed for severe pain. (Patient not taking: Reported on 08/22/2017) 15 tablet 0 Not Taking at Unknown time  . sertraline (ZOLOFT) 25 MG tablet Take 1 tablet (25 mg total) by mouth daily. (Patient not taking: Reported on 06/05/2017) 30 tablet 3 Not Taking at Unknown time    Review of Systems  Constitutional: Negative.  Negative for  chills and fever.  Gastrointestinal: Negative.   Genitourinary: Positive for dysuria, flank pain and frequency. Negative for hematuria and vaginal bleeding.   Physical Exam   Blood pressure 123/75, pulse 94, temperature 97.8 F (36.6 C), temperature source Oral, resp. rate 18, weight 146 lb 12 oz (66.6 kg), last menstrual period 03/08/2017, unknown if currently breastfeeding.   Physical Exam  Nursing note and vitals reviewed. Constitutional: She is oriented to person, place, and time. She appears well-developed and well-nourished. She appears distressed (pt crying in bed).  HENT:  Head: Normocephalic and atraumatic.  Eyes: Conjunctivae are normal. Right eye exhibits no discharge. Left eye exhibits no discharge. No scleral icterus.  Neck: Normal range of motion.  Respiratory: Effort normal. No respiratory distress.  GI: Soft. There is CVA tenderness (right).  Neurological: She is alert and oriented to person, place, and time.  Skin: Skin is warm and dry. She is not diaphoretic.  Psychiatric: She has a normal mood and affect. Her behavior is normal. Judgment and thought content normal.    MAU Course  Procedures Results for orders placed or performed during the hospital encounter of 08/22/17 (from the past 24 hour(s))  Urinalysis, Routine  w reflex microscopic     Status: Abnormal   Collection Time: 08/22/17  9:20 AM  Result Value Ref Range   Color, Urine YELLOW YELLOW   APPearance CLOUDY (A) CLEAR   Specific Gravity, Urine 1.013 1.005 - 1.030   pH 7.0 5.0 - 8.0   Glucose, UA NEGATIVE NEGATIVE mg/dL   Hgb urine dipstick MODERATE (A) NEGATIVE   Bilirubin Urine NEGATIVE NEGATIVE   Ketones, ur 5 (A) NEGATIVE mg/dL   Protein, ur 161 (A) NEGATIVE mg/dL   Nitrite POSITIVE (A) NEGATIVE   Leukocytes, UA LARGE (A) NEGATIVE   RBC / HPF 21-50 0 - 5 RBC/hpf   WBC, UA >50 (H) 0 - 5 WBC/hpf   Bacteria, UA MANY (A) NONE SEEN   Squamous Epithelial / LPF 0-5 0 - 5   WBC Clumps PRESENT     Mucus PRESENT     MDM NST:  Baseline: 145 bpm, Variability: Good {> 6 bpm), Accelerations: Non-reactive but appropriate for gestational age, Decelerations: Variable: mild and no contractions   U/a shows evidence of UTI with + nitrites & pyuria. + CVAT & pt complaining of right flank pain. Afebrile.   D/w Dr. Vergie Living. Will admit for IV abx r/t pyelo in pregnancy.  Urine culture pending.  Labs, renal ultrasound, abx, & IV fluids ordered  Assessment and Plan  A:  1. Pyelonephritis affecting pregnancy in second trimester   2. [redacted] weeks gestation of pregnancy   3. Flank pain    P: Admit  IV rocephin 2 gm Q24 hours NST daily CBC, BMP, & urine culture pending Renal ultrasound ordered  Judeth Horn 08/22/2017, 10:09 AM

## 2017-08-22 NOTE — H&P (Signed)
History   CSN: [id number]  Arrival date and time: 08/22/17 4098  First Provider Initiated Contact with Patient 08/22/17 1023           Chief Complaint  Patient presents with  . Dysuria   HPI Melanie Warner is a 37 y.o. G2P1001 at [redacted]w[redacted]d who presents with dysuria and flank pain. Hx of pyelo in previous pregnancy and was diagnosed with bilateral kidney stones earlier this month.  Reports symptoms of dysuria & urinary frequency that started yesterday. Since this morning has had right flank pain since this morning. Rates pain 7/10. Has not treated symptoms. Denies fever/chills, n/v, hematuria. Positive fetal movement.           OB History    Gravida  2   Para  1   Term  1   Preterm      AB      Living  1     SAB      TAB      Ectopic      Multiple      Live Births  1               Past Medical History:  Diagnosis Date  . Chronic kidney disease    kidney infection with first pregnancy  . Depression          Past Surgical History:  Procedure Laterality Date  . BREAST SURGERY           Family History  Problem Relation Age of Onset  . Hypertension Father     Social History       Tobacco Use  . Smoking status: Never Smoker  . Smokeless tobacco: Never Used  Substance Use Topics  . Alcohol use: No  . Drug use: No    Allergies: No Known Allergies         Medications Prior to Admission  Medication Sig Dispense Refill Last Dose  . Prenatal Multivit-Min-Fe-FA (PRENATAL VITAMINS PO) Take 1 tablet by mouth daily.    08/21/2017 at Unknown time  . oxyCODONE-acetaminophen (PERCOCET/ROXICET) 5-325 MG tablet Take 2 tablets by mouth every 6 (six) hours as needed for severe pain. (Patient not taking: Reported on 08/22/2017) 15 tablet 0 Not Taking at Unknown time  . sertraline (ZOLOFT) 25 MG tablet Take 1 tablet (25 mg total) by mouth daily. (Patient not taking: Reported on 06/05/2017) 30 tablet 3 Not Taking at Unknown time     Review of Systems  Constitutional: Negative.  Negative for chills and fever.  Gastrointestinal: Negative.   Genitourinary: Positive for dysuria, flank pain and frequency. Negative for hematuria and vaginal bleeding.   Physical Exam   Blood pressure 123/75, pulse 94, temperature 97.8 F (36.6 C), temperature source Oral, resp. rate 18, weight 146 lb 12 oz (66.6 kg), last menstrual period 03/08/2017, unknown if currently breastfeeding.   Physical Exam  Nursing note and vitals reviewed. Constitutional: She is oriented to person, place, and time. She appears well-developed and well-nourished. She appears distressed (pt crying in bed).  HENT:  Head: Normocephalic and atraumatic.  Eyes: Conjunctivae are normal. Right eye exhibits no discharge. Left eye exhibits no discharge. No scleral icterus.  Neck: Normal range of motion.  Respiratory: Effort normal. No respiratory distress.  GI: Soft. There is CVA tenderness (right).  Neurological: She is alert and oriented to person, place, and time.  Skin: Skin is warm and dry. She is not diaphoretic.  Psychiatric: She has a normal mood and affect. Her behavior  is normal. Judgment and thought content normal.    MAU Course  Procedures LabResultsLast24Hours       Results for orders placed or performed during the hospital encounter of 08/22/17 (from the past 24 hour(s))  Urinalysis, Routine w reflex microscopic     Status: Abnormal   Collection Time: 08/22/17  9:20 AM  Result Value Ref Range   Color, Urine YELLOW YELLOW   APPearance CLOUDY (A) CLEAR   Specific Gravity, Urine 1.013 1.005 - 1.030   pH 7.0 5.0 - 8.0   Glucose, UA NEGATIVE NEGATIVE mg/dL   Hgb urine dipstick MODERATE (A) NEGATIVE   Bilirubin Urine NEGATIVE NEGATIVE   Ketones, ur 5 (A) NEGATIVE mg/dL   Protein, ur 161 (A) NEGATIVE mg/dL   Nitrite POSITIVE (A) NEGATIVE   Leukocytes, UA LARGE (A) NEGATIVE   RBC / HPF 21-50 0 - 5 RBC/hpf   WBC, UA >50  (H) 0 - 5 WBC/hpf   Bacteria, UA MANY (A) NONE SEEN   Squamous Epithelial / LPF 0-5 0 - 5   WBC Clumps PRESENT    Mucus PRESENT       MDM NST:  Baseline: 145 bpm, Variability: Good {> 6 bpm), Accelerations: Non-reactive but appropriate for gestational age, Decelerations: Variable: mild and no contractions   U/a shows evidence of UTI with + nitrites & pyuria. + CVAT & pt complaining of right flank pain. Afebrile.   D/w Dr. Vergie Living. Will admit for IV abx r/t pyelo in pregnancy.  Urine culture pending.  Labs, renal ultrasound, abx, & IV fluids ordered  Assessment and Plan  A:  1. Pyelonephritis affecting pregnancy in second trimester   2. [redacted] weeks gestation of pregnancy   3. Flank pain    P: Admit  IV rocephin 2 gm Q24 hours NST daily CBC, BMP, & urine culture pending Renal ultrasound ordered  Melanie Warner 08/22/2017, 10:09 AM    Agree with above *Pregnancy: qday NSTs starting tomorrow, PNV *Pyelo: r>l. F/u UCx. Small non obstructing stone on right and likely on left. Pt amenable to flomax; urine strainer ordered. Continue IV abx. D/w pt that will need on ppx for rest of pregnancy.  *AMA: no issues *FEN/GI: MIVF, regular diet *PPx: SCDs *Dispo: possibly late tomorrow or HD#3

## 2017-08-23 MED ORDER — SODIUM CHLORIDE 0.9 % IV SOLN
2.0000 g | INTRAVENOUS | Status: DC
  Administered 2017-08-23 – 2017-08-24 (×2): 2 g via INTRAVENOUS
  Filled 2017-08-23 (×2): qty 20

## 2017-08-23 MED ORDER — SODIUM CHLORIDE 0.9 % IV SOLN
INTRAVENOUS | Status: DC
  Administered 2017-08-23 – 2017-08-24 (×3): via INTRAVENOUS

## 2017-08-23 NOTE — Progress Notes (Signed)
Patient ID: Melanie Warner, female   DOB: 08-May-1980, 37 y.o.   MRN: [id number] FACULTY PRACTICE ANTEPARTUM(COMPREHENSIVE) NOTE  Melanie Warner is a 37 y.o. G2P1001 at [redacted]w[redacted]d  who is admitted for the management of pyelonephritis in pregnancy.    Fetal presentation is unsure. Length of Stay:  1  Days  Date of admission:08/22/2017  Subjective: Patient reports improvement in her symptoms. Patient reports the fetal movement as active. Patient reports uterine contraction  activity as none. Patient reports  vaginal bleeding as none. Patient describes fluid per vagina as None.  Vitals:  Blood pressure (!) 85/41, pulse 82, temperature 98.1 F (36.7 C), temperature source Oral, resp. rate 16, height 5\' 4"  (1.626 m), weight 146 lb 12 oz (66.6 kg), last menstrual period 03/08/2017, SpO2 100 %, unknown if currently breastfeeding. Vitals:   08/22/17 2026 08/23/17 0036 08/23/17 0604 08/23/17 0703  BP: 101/64 (!) 90/49 (!) 89/52 (!) 85/41  Pulse: 90 81 81 82  Resp: 16 16 16 16   Temp: 98.5 F (36.9 C) 98.6 F (37 C) 98 F (36.7 C) 98.1 F (36.7 C)  TempSrc: Oral   Oral  SpO2: 99% 98% 100% 100%  Weight:      Height:       Physical Examination: GENERAL: Well-developed, well-nourished female in no acute distress.  LUNGS: Clear to auscultation bilaterally.  ABDOMEN: Soft, nontender, gravid PELVIC: Not indicated. EXTREMITIES: No cyanosis, clubbing, or edema, 2+ distal pulses. Back: Right CVA tenderness   Fetal Monitoring:  Baseline: 145 bpm, Variability: Good {> 6 bpm), Accelerations: Non-reactive but appropriate for gestational age and Decelerations: Absent     Labs:  Results for orders placed or performed during the hospital encounter of 08/22/17 (from the past 24 hour(s))  CBC with Differential/Platelet   Collection Time: 08/22/17 10:57 AM  Result Value Ref Range   WBC 12.1 (H) 4.0 - 10.5 K/uL   RBC 3.87 3.87 - 5.11 MIL/uL   Hemoglobin 12.2 12.0 - 15.0 g/dL   HCT 40.9  (L) 81.1 - 46.0 %   MCV 90.7 78.0 - 100.0 fL   MCH 31.5 26.0 - 34.0 pg   MCHC 34.8 30.0 - 36.0 g/dL   RDW 91.4 78.2 - 95.6 %   Platelets 228 150 - 400 K/uL   Neutrophils Relative % 80 %   Neutro Abs 9.6 (H) 1.7 - 7.7 K/uL   Lymphocytes Relative 14 %   Lymphs Abs 1.7 0.7 - 4.0 K/uL   Monocytes Relative 5 %   Monocytes Absolute 0.5 0.1 - 1.0 K/uL   Eosinophils Relative 1 %   Eosinophils Absolute 0.2 0.0 - 0.7 K/uL   Basophils Relative 0 %   Basophils Absolute 0.0 0.0 - 0.1 K/uL  Basic metabolic panel   Collection Time: 08/22/17 10:57 AM  Result Value Ref Range   Sodium 135 135 - 145 mmol/L   Potassium 3.7 3.5 - 5.1 mmol/L   Chloride 107 101 - 111 mmol/L   CO2 18 (L) 22 - 32 mmol/L   Glucose, Bld 118 (H) 65 - 99 mg/dL   BUN 9 6 - 20 mg/dL   Creatinine, Ser 2.13 (L) 0.44 - 1.00 mg/dL   Calcium 8.8 (L) 8.9 - 10.3 mg/dL   GFR calc non Af Amer NOT CALCULATED >60 mL/min   GFR calc Af Amer NOT CALCULATED >60 mL/min   Anion gap 10 5 - 15    Imaging Studies:      Medications:  Scheduled . prenatal multivitamin  1 tablet Oral  Q1200  . tamsulosin  0.4 mg Oral Daily   I have reviewed the patient's current medications.  ASSESSMENT: G2P1001 2738w0d with pyelonephritis Patient Active Problem List   Diagnosis Date Noted  . AMA (advanced maternal age) multigravida 35+ 08/22/2017  . Bilateral nephrolithiasis 08/22/2017  . Breast lump 06/06/2017  . Supervision of other normal pregnancy, antepartum 06/05/2017    PLAN: - Patient remains afebrile - fetal status remains reassuring - Continue IV antibiotics - Discharge planning tomorrow  Keiosha Cancro 08/23/2017,9:25 AM

## 2017-08-24 DIAGNOSIS — R109 Unspecified abdominal pain: Secondary | ICD-10-CM

## 2017-08-24 DIAGNOSIS — O2302 Infections of kidney in pregnancy, second trimester: Secondary | ICD-10-CM

## 2017-08-24 DIAGNOSIS — O9989 Other specified diseases and conditions complicating pregnancy, childbirth and the puerperium: Secondary | ICD-10-CM

## 2017-08-24 LAB — CULTURE, OB URINE: Culture: >=100000 — AB

## 2017-08-24 MED ORDER — SULFAMETHOXAZOLE-TRIMETHOPRIM 800-160 MG PO TABS: 1.0000 | ORAL_TABLET | Freq: Two times a day (BID) | ORAL | 0 refills | Status: DC

## 2017-08-24 MED ORDER — NITROFURANTOIN MONOHYD MACRO 100 MG PO CAPS: 100.0000 mg | ORAL_CAPSULE | Freq: Every day | ORAL | 2 refills | Status: DC

## 2017-08-24 NOTE — Discharge Summary (Signed)
Antenatal Physician Discharge Summary  Patient ID: Melanie Warner MRN: [id number] DOB/AGE: 1980/09/15 37 y.o.  Admit date: 08/22/2017 Discharge date: 08/24/2017  Admission Diagnoses: Principal Problem:   Pyelonephritis affecting pregnancy in second trimester Active Problems:   AMA (advanced maternal age) multigravida 35+   Bilateral nephrolithiasis    Discharge Diagnoses: Same  Prenatal Procedures: ultrasound and urine strainer, IV antibiotics  Consults: None  Hospital Course:  This is a 37 y.o. G2P1001 with IUP at [redacted]w[redacted]d admitted for presumed pyelonephritis. Diagnosed with un-obstructing kidney stone 4 wks prior to admission. She was admitted with dysuria, flank pain, h/o kidney stones and mildly elevated WBC count at 12.1. She was started on IV rocephin twice daily. She was observed, fetal heart rate monitoring remained reassuring, and she had no fever. Renal u;/s showed debris in bladder, but no obstruction. Urine culture showed pan-sensitvie E. Coli. She was deemed stable for discharge to home with outpatient follow up.  Discharge Exam: Temp:  [98 F (36.7 C)-98.6 F (37 C)] 98.3 F (36.8 C) (06/02 0745) Pulse Rate:  [73-99] 79 (06/02 0745) Resp:  [16-18] 18 (06/02 0745) BP: (90-103)/(56-67) 94/63 (06/02 0745) SpO2:  [98 %-100 %] 100 % (06/02 0745) Physical Examination: CONSTITUTIONAL: Well-developed, well-nourished female in no acute distress.  HENT:  Normocephalic, atraumatic, External right and left ear normal.  EYES:  EOM are normal. No scleral icterus.  NECK: Normal range of motion, supple, no masses SKIN: Skin is warm and dry. No rash noted. NEUROLGIC: Alert and oriented to person, place, and time.  PSYCHIATRIC: Normal mood and affect. Normal behavior. Normal judgment and thought content. CARDIOVASCULAR: Normal heart rate noted, regular rhythm RESPIRATORY: Effort and breath sounds normal MUSCULOSKELETAL: Normal range of motion. No edema and no tenderness.  2+ distal pulses. ABDOMEN: Soft, nontender, nondistended, gravid.  Fetal monitoring: FHR: 150 bpm, Variability: moderate, Accelerations: Present, Decelerations: Absent  Uterine activity: 0 contractions per hour  Significant Diagnostic Studies:  Results for orders placed or performed during the hospital encounter of 08/22/17 (from the past 168 hour(s))  Culture, OB Urine   Collection Time: 08/22/17  9:20 AM  Result Value Ref Range   Specimen Description      OB CLEAN CATCH Performed at [facility], [address], [place], [place] [address]    Special Requests      NONE Performed at [facility], [address], [place], [place] [address]    Culture (A)     >=100,000 COLONIES/mL ESCHERICHIA COLI NO GROUP B STREP (S.AGALACTIAE) ISOLATED Performed at [facility], [address], [place], [place] [address]    Report Status 08/24/2017 FINAL    Organism ID, Bacteria ESCHERICHIA COLI (A)       Susceptibility   Escherichia coli - MIC*    AMPICILLIN 8 SENSITIVE Sensitive     CEFAZOLIN <=4 SENSITIVE Sensitive     CEFEPIME <=1 SENSITIVE Sensitive     CEFTAZIDIME <=1 SENSITIVE Sensitive     CEFTRIAXONE <=1 SENSITIVE Sensitive     CIPROFLOXACIN <=0.25 SENSITIVE Sensitive     GENTAMICIN <=1 SENSITIVE Sensitive     IMIPENEM <=0.25 SENSITIVE Sensitive     TRIMETH/SULFA <=20 SENSITIVE Sensitive     AMPICILLIN/SULBACTAM <=2 SENSITIVE Sensitive     PIP/TAZO <=4 SENSITIVE Sensitive     Extended ESBL NEGATIVE Sensitive     * >=100,000 COLONIES/mL ESCHERICHIA COLI  Urinalysis, Routine w reflex microscopic   Collection Time: 08/22/17  9:20 AM  Result Value Ref Range   Color, Urine YELLOW  YELLOW   APPearance CLOUDY (A) CLEAR   Specific Gravity, Urine 1.013 1.005 - 1.030   pH 7.0 5.0 - 8.0   Glucose, UA NEGATIVE NEGATIVE mg/dL   Hgb urine dipstick MODERATE (A) NEGATIVE   Bilirubin Urine NEGATIVE NEGATIVE   Ketones, ur 5 (A) NEGATIVE mg/dL   Protein, ur 161 (A) NEGATIVE  mg/dL   Nitrite POSITIVE (A) NEGATIVE   Leukocytes, UA LARGE (A) NEGATIVE   RBC / HPF 21-50 0 - 5 RBC/hpf   WBC, UA >50 (H) 0 - 5 WBC/hpf   Bacteria, UA MANY (A) NONE SEEN   Squamous Epithelial / LPF 0-5 0 - 5   WBC Clumps PRESENT    Mucus PRESENT   CBC with Differential/Platelet   Collection Time: 08/22/17 10:57 AM  Result Value Ref Range   WBC 12.1 (H) 4.0 - 10.5 K/uL   RBC 3.87 3.87 - 5.11 MIL/uL   Hemoglobin 12.2 12.0 - 15.0 g/dL   HCT 09.6 (L) 04.5 - 40.9 %   MCV 90.7 78.0 - 100.0 fL   MCH 31.5 26.0 - 34.0 pg   MCHC 34.8 30.0 - 36.0 g/dL   RDW 81.1 91.4 - 78.2 %   Platelets 228 150 - 400 K/uL   Neutrophils Relative % 80 %   Neutro Abs 9.6 (H) 1.7 - 7.7 K/uL   Lymphocytes Relative 14 %   Lymphs Abs 1.7 0.7 - 4.0 K/uL   Monocytes Relative 5 %   Monocytes Absolute 0.5 0.1 - 1.0 K/uL   Eosinophils Relative 1 %   Eosinophils Absolute 0.2 0.0 - 0.7 K/uL   Basophils Relative 0 %   Basophils Absolute 0.0 0.0 - 0.1 K/uL  Basic metabolic panel   Collection Time: 08/22/17 10:57 AM  Result Value Ref Range   Sodium 135 135 - 145 mmol/L   Potassium 3.7 3.5 - 5.1 mmol/L   Chloride 107 101 - 111 mmol/L   CO2 18 (L) 22 - 32 mmol/L   Glucose, Bld 118 (H) 65 - 99 mg/dL   BUN 9 6 - 20 mg/dL   Creatinine, Ser 9.56 (L) 0.44 - 1.00 mg/dL   Calcium 8.8 (L) 8.9 - 10.3 mg/dL   GFR calc non Af Amer NOT CALCULATED >60 mL/min   GFR calc Af Amer NOT CALCULATED >60 mL/min   Anion gap 10 5 - 15    Future Appointments: Future Appointments  Date Time Provider Department Center  08/25/2017  2:00 PM [facility] NURSE [facility]  08/29/2017  1:15 PM Degele, Kandra Nicolas, MD [facility]  09/18/2017  8:15 AM Marylene Land, CNM [facility]  09/18/2017  8:50 AM [facility] LAB [facility]    Discharge Condition: Stable  Disposition: Discharge disposition: 01-Home or Self Care       Discharge Instructions    Discharge activity:  No Restrictions   Complete by:  As directed    Discharge  diet:  No restrictions   Complete by:  As directed    Fetal Kick Count:  Lie on our left side for one hour after a meal, and count the number of times your baby kicks.  If it is less than 5 times, get up, move around and drink some juice.  Repeat the test 30 minutes later.  If it is still less than 5 kicks in an hour, notify your doctor.   Complete by:  As directed    No sexual activity restrictions   Complete by:  As directed  Notify physician for a general feeling that "something is not right"   Complete by:  As directed    Notify physician for increase or change in vaginal discharge   Complete by:  As directed    Notify physician for intestinal cramps, with or without diarrhea, sometimes described as "gas pain"   Complete by:  As directed    Notify physician for leaking of fluid   Complete by:  As directed    Notify physician for low, dull backache, unrelieved by heat or Tylenol   Complete by:  As directed    Notify physician for menstrual like cramps   Complete by:  As directed    Notify physician for pelvic pressure   Complete by:  As directed    Notify physician for uterine contractions.  These may be painless and feel like the uterus is tightening or the baby is  "balling up"   Complete by:  As directed    Notify physician for vaginal bleeding   Complete by:  As directed    PRETERM LABOR:  Includes any of the follwing symptoms that occur between 20 - [redacted] weeks gestation.  If these symptoms are not stopped, preterm labor can result in preterm delivery, placing your baby at risk   Complete by:  As directed      Allergies as of 08/24/2017   No Known Allergies     Medication List    TAKE these medications   nitrofurantoin (macrocrystal-monohydrate) 100 MG capsule Commonly known as:  MACROBID Take 1 capsule (100 mg total) by mouth at bedtime.   sulfamethoxazole-trimethoprim 800-160 MG tablet Commonly known as:  BACTRIM DS,SEPTRA DS Take 1 tablet by mouth 2 (two) times  daily.      Follow-up Information    [facility]   Specialty:  Obstetrics and Gynecology Why:  Keep next scheduled appointment Contact information: [address] [place] [id number] [phone]         Future Appointments  Date Time Provider Department Center  08/25/2017  2:00 PM [facility] NURSE [facility]  08/29/2017  1:15 PM Degele, Kandra NicolasJulie P, MD [facility]  09/18/2017  8:15 AM Marylene LandKooistra, Kathryn Lorraine, CNM [facility]  09/18/2017  8:50 AM [facility] LAB [facility]    Signed: Reva Boresanya S Dana Dorner M.D. 08/24/2017, 10:33 AM

## 2017-08-24 NOTE — Discharge Instructions (Signed)

## 2017-08-24 NOTE — Plan of Care (Signed)
Pt is free of pain or discomfort. Skin integrity is intact and maintained.

## 2017-08-24 NOTE — Progress Notes (Signed)
Discharged home, ambulatory, in stable condition. 

## 2017-08-24 NOTE — Progress Notes (Signed)
Discharge instructions given and patient voiced understanding of all instructions provided. Written copy of AVS given to patient.

## 2017-08-25 ENCOUNTER — Ambulatory Visit: Payer: Self-pay

## 2017-08-29 ENCOUNTER — Ambulatory Visit (INDEPENDENT_AMBULATORY_CARE_PROVIDER_SITE_OTHER): Payer: Self-pay | Admitting: Family Medicine

## 2017-08-29 VITALS — BP 108/68 | HR 88 | Wt 141.1 lb

## 2017-08-29 DIAGNOSIS — O2302 Infections of kidney in pregnancy, second trimester: Secondary | ICD-10-CM

## 2017-08-29 DIAGNOSIS — N63 Unspecified lump in unspecified breast: Secondary | ICD-10-CM

## 2017-08-29 DIAGNOSIS — O09522 Supervision of elderly multigravida, second trimester: Secondary | ICD-10-CM

## 2017-08-29 DIAGNOSIS — Z3480 Encounter for supervision of other normal pregnancy, unspecified trimester: Secondary | ICD-10-CM

## 2017-08-29 NOTE — Progress Notes (Signed)
   PRENATAL VISIT NOTE  Subjective:  Melanie Warner is a 37 y.o. G2P1001 at 7273w6d being seen today for ongoing prenatal care.  She is currently monitored for the following issues for this low-risk pregnancy and has Supervision of other normal pregnancy, antepartum; Breast lump; AMA (advanced maternal age) multigravida 35+; Bilateral nephrolithiasis; and Pyelonephritis affecting pregnancy in second trimester on their problem list.  Patient reports no complaints.  Contractions: Not present. Vag. Bleeding: None.  Movement: Present. Denies leaking of fluid.   The following portions of the patient's history were reviewed and updated as appropriate: allergies, current medications, past family history, past medical history, past social history, past surgical history and problem list. Problem list updated.  Objective:   Vitals:   08/29/17 1339  BP: 108/68  Pulse: 88  Weight: 141 lb 1.6 oz (64 kg)    Fetal Status: Fetal Heart Rate (bpm): 139 Fundal Height: 23 cm Movement: Present     General:  Alert, oriented and cooperative. Patient is in no acute distress.  Skin: Skin is warm and dry. No rash noted.   Cardiovascular: Normal heart rate noted  Respiratory: Normal respiratory effort, no problems with respiration noted  Abdomen: Soft, gravid, appropriate for gestational age.  Pain/Pressure: Present     Pelvic: Cervical exam deferred        Extremities: Normal range of motion.  Edema: None  Mental Status: Normal mood and affect. Normal behavior. Normal judgment and thought content.   Assessment and Plan:  Pregnancy: G2P1001 at 8773w6d  1. Supervision of other normal pregnancy, antepartum - Continue routine care - F/u in 4 weeks for PNV w/ 28-week labs  2. Multigravida of advanced maternal age in second trimester Had low risk NIPS  3. Pyelonephritis affecting pregnancy in second trimester Recent admission, discharged 5 days ago. Reports she is finishing up course of antibiotics.  Doing well, has not had any fever or symptoms at home.  - Start suppression with Macrobid once course of bactrim complete ( already has rx)  4. Breast lump - Has breast U/S scheduled  Preterm labor symptoms and general obstetric precautions including but not limited to vaginal bleeding, contractions, leaking of fluid and fetal movement were reviewed in detail with the patient. Please refer to After Visit Summary for other counseling recommendations.  Return in about 1 month (around 09/26/2017) for 28-wk LOB and 28-wk labs w/ 2hr GTT.  Future Appointments  Date Time Provider Department Center  09/18/2017  8:15 AM Marylene LandKooistra, Kathryn Lorraine[facility]  09/18/2017  8:50 AM [facility] LAB [facility]    Frederik PearJulie P Nolyn Swab, MD

## 2017-09-17 ENCOUNTER — Other Ambulatory Visit: Payer: Self-pay | Admitting: *Deleted

## 2017-09-17 DIAGNOSIS — Z3480 Encounter for supervision of other normal pregnancy, unspecified trimester: Secondary | ICD-10-CM

## 2017-09-17 DIAGNOSIS — O09522 Supervision of elderly multigravida, second trimester: Secondary | ICD-10-CM

## 2017-09-18 ENCOUNTER — Other Ambulatory Visit: Payer: Self-pay

## 2017-09-18 ENCOUNTER — Encounter: Payer: Self-pay | Admitting: Student

## 2017-10-16 ENCOUNTER — Encounter: Payer: Self-pay | Admitting: Student

## 2017-10-16 ENCOUNTER — Telehealth: Payer: Self-pay | Admitting: Student

## 2017-10-16 NOTE — Telephone Encounter (Signed)
Called patient about her missed appointment. She stated her car wouldn't start.

## 2017-10-22 ENCOUNTER — Other Ambulatory Visit: Payer: Self-pay

## 2017-10-22 DIAGNOSIS — Z3480 Encounter for supervision of other normal pregnancy, unspecified trimester: Secondary | ICD-10-CM

## 2017-10-23 LAB — CBC
Hematocrit: 32.8 % — ABNORMAL LOW (ref 34.0–46.6)
Hemoglobin: 10.8 g/dL — ABNORMAL LOW (ref 11.1–15.9)
MCH: 28.1 pg (ref 26.6–33.0)
MCHC: 32.9 g/dL (ref 31.5–35.7)
MCV: 85 fL (ref 79–97)
PLATELETS: 242 10*3/uL (ref 150–450)
RBC: 3.85 x10E6/uL (ref 3.77–5.28)
RDW: 12.5 % (ref 12.3–15.4)
WBC: 10.5 10*3/uL (ref 3.4–10.8)

## 2017-10-23 LAB — GLUCOSE TOLERANCE, 2 HOURS W/ 1HR
GLUCOSE, 1 HOUR: 144 mg/dL (ref 65–179)
GLUCOSE, 2 HOUR: 127 mg/dL (ref 65–152)
GLUCOSE, FASTING: 91 mg/dL (ref 65–91)

## 2017-10-23 LAB — HIV ANTIBODY (ROUTINE TESTING W REFLEX): HIV SCREEN 4TH GENERATION: NONREACTIVE

## 2017-10-23 LAB — RPR: RPR Ser Ql: NONREACTIVE

## 2017-10-29 ENCOUNTER — Ambulatory Visit (INDEPENDENT_AMBULATORY_CARE_PROVIDER_SITE_OTHER): Payer: Self-pay | Admitting: Nurse Practitioner

## 2017-10-29 VITALS — BP 110/78 | HR 101 | Wt 164.7 lb

## 2017-10-29 DIAGNOSIS — O09529 Supervision of elderly multigravida, unspecified trimester: Secondary | ICD-10-CM

## 2017-10-29 DIAGNOSIS — O2302 Infections of kidney in pregnancy, second trimester: Secondary | ICD-10-CM

## 2017-10-29 DIAGNOSIS — O09523 Supervision of elderly multigravida, third trimester: Secondary | ICD-10-CM

## 2017-10-29 DIAGNOSIS — O2303 Infections of kidney in pregnancy, third trimester: Secondary | ICD-10-CM

## 2017-10-29 DIAGNOSIS — Z3480 Encounter for supervision of other normal pregnancy, unspecified trimester: Secondary | ICD-10-CM

## 2017-10-29 DIAGNOSIS — Z23 Encounter for immunization: Secondary | ICD-10-CM

## 2017-10-29 DIAGNOSIS — Z3483 Encounter for supervision of other normal pregnancy, third trimester: Secondary | ICD-10-CM

## 2017-10-29 NOTE — Patient Instructions (Signed)
Braxton Hicks Contractions °Contractions of the uterus can occur throughout pregnancy, but they are not always a sign that you are in labor. You may have practice contractions called Braxton Hicks contractions. These false labor contractions are sometimes confused with true labor. °What are Braxton Hicks contractions? °Braxton Hicks contractions are tightening movements that occur in the muscles of the uterus before labor. Unlike true labor contractions, these contractions do not result in opening (dilation) and thinning of the cervix. Toward the end of pregnancy (32-34 weeks), Braxton Hicks contractions can happen more often and may become stronger. These contractions are sometimes difficult to tell apart from true labor because they can be very uncomfortable. You should not feel embarrassed if you go to the hospital with false labor. °Sometimes, the only way to tell if you are in true labor is for your health care provider to look for changes in the cervix. The health care provider will do a physical exam and may monitor your contractions. If you are not in true labor, the exam should show that your cervix is not dilating and your water has not broken. °If there are other health problems associated with your pregnancy, it is completely safe for you to be sent home with false labor. You may continue to have Braxton Hicks contractions until you go into true labor. °How to tell the difference between true labor and false labor °True labor °· Contractions last 30-70 seconds. °· Contractions become very regular. °· Discomfort is usually felt in the top of the uterus, and it spreads to the lower abdomen and low back. °· Contractions do not go away with walking. °· Contractions usually become more intense and increase in frequency. °· The cervix dilates and gets thinner. °False labor °· Contractions are usually shorter and not as strong as true labor contractions. °· Contractions are usually irregular. °· Contractions  are often felt in the front of the lower abdomen and in the groin. °· Contractions may go away when you walk around or change positions while lying down. °· Contractions get weaker and are shorter-lasting as time goes on. °· The cervix usually does not dilate or become thin. °Follow these instructions at home: °· Take over-the-counter and prescription medicines only as told by your health care provider. °· Keep up with your usual exercises and follow other instructions from your health care provider. °· Eat and drink lightly if you think you are going into labor. °· If Braxton Hicks contractions are making you uncomfortable: °? Change your position from lying down or resting to walking, or change from walking to resting. °? Sit and rest in a tub of warm water. °? Drink enough fluid to keep your urine pale yellow. Dehydration may cause these contractions. °? Do slow and deep breathing several times an hour. °· Keep all follow-up prenatal visits as told by your health care provider. This is important. °Contact a health care provider if: °· You have a fever. °· You have continuous pain in your abdomen. °Get help right away if: °· Your contractions become stronger, more regular, and closer together. °· You have fluid leaking or gushing from your vagina. °· You pass blood-tinged mucus (bloody show). °· You have bleeding from your vagina. °· You have low back pain that you never had before. °· You feel your baby’s head pushing down and causing pelvic pressure. °· Your baby is not moving inside you as much as it used to. °Summary °· Contractions that occur before labor are called Braxton   Hicks contractions, false labor, or practice contractions. °· Braxton Hicks contractions are usually shorter, weaker, farther apart, and less regular than true labor contractions. True labor contractions usually become progressively stronger and regular and they become more frequent. °· Manage discomfort from Braxton Hicks contractions by  changing position, resting in a warm bath, drinking plenty of water, or practicing deep breathing. °This information is not intended to replace advice given to you by your health care provider. Make sure you discuss any questions you have with your health care provider. °Document Released: 07/25/2016 Document Revised: 07/25/2016 Document Reviewed: 07/25/2016 °Elsevier Interactive Patient Education © 2018 [employer] ° °

## 2017-10-29 NOTE — Progress Notes (Signed)
    Subjective:  Melanie Warner is a 37 y.o. G2P1001 at 7173w4d being seen today for ongoing prenatal care.  She is currently monitored for the following issues for this low-risk pregnancy and has Supervision of other normal pregnancy, antepartum; Breast lump; AMA (advanced maternal age) multigravida 35+; Bilateral nephrolithiasis; and Pyelonephritis affecting pregnancy in second trimester on their problem list.  Patient reports no complaints.  Contractions: Irregular. Vag. Bleeding: None.  Movement: Present. Denies leaking of fluid.  She has not been for visits since 24 weeks.  Had glucola last week.  The following portions of the patient's history were reviewed and updated as appropriate: allergies, current medications, past family history, past medical history, past social history, past surgical history and problem list. Problem list updated.  Objective:   Vitals:   10/29/17 1511  BP: 110/78  Pulse: (!) 101  Weight: 164 lb 11.2 oz (74.7 kg)    Fetal Status: Fetal Heart Rate (bpm): 143   Movement: Present     General:  Alert, oriented and cooperative. Patient is in no acute distress.  Skin: Skin is warm and dry. No rash noted.   Cardiovascular: Normal heart rate noted  Respiratory: Normal respiratory effort, no problems with respiration noted  Abdomen: Soft, gravid, appropriate for gestational age. Pain/Pressure: Absent     Pelvic:  Cervical exam deferred        Extremities: Normal range of motion.  Edema: Trace  Mental Status: Normal mood and affect. Normal behavior. Normal judgment and thought content.   Urinalysis:      Assessment and Plan:  Pregnancy: G2P1001 at 5673w4d  1. Supervision of other normal pregnancy, antepartum  - Tdap vaccine greater than or equal to 7yo IM  2. Antepartum multigravida of advanced maternal age  - Tdap vaccine greater than or equal to 7yo IM  3. Pyelonephritis affecting pregnancy in second trimester On Macrobid for suppression -  continues to take.  Preterm labor symptoms and general obstetric precautions including but not limited to vaginal bleeding, contractions, leaking of fluid and fetal movement were reviewed in detail with the patient. Please refer to After Visit Summary for other counseling recommendations.  Return in about 2 weeks (around 11/12/2017).  Nolene BernheimERRI BURLESON, RN, MSN, NP-BC Nurse Practitioner, [facility], [facility] 10/29/2017 3:24 PM

## 2017-11-12 ENCOUNTER — Ambulatory Visit (INDEPENDENT_AMBULATORY_CARE_PROVIDER_SITE_OTHER): Payer: Self-pay | Admitting: Advanced Practice Midwife

## 2017-11-12 VITALS — BP 118/74 | HR 10 | Wt 169.0 lb

## 2017-11-12 DIAGNOSIS — Z3483 Encounter for supervision of other normal pregnancy, third trimester: Secondary | ICD-10-CM

## 2017-11-12 NOTE — Patient Instructions (Signed)
Braxton Hicks Contractions °Contractions of the uterus can occur throughout pregnancy, but they are not always a sign that you are in labor. You may have practice contractions called Braxton Hicks contractions. These false labor contractions are sometimes confused with true labor. °What are Braxton Hicks contractions? °Braxton Hicks contractions are tightening movements that occur in the muscles of the uterus before labor. Unlike true labor contractions, these contractions do not result in opening (dilation) and thinning of the cervix. Toward the end of pregnancy (32-34 weeks), Braxton Hicks contractions can happen more often and may become stronger. These contractions are sometimes difficult to tell apart from true labor because they can be very uncomfortable. You should not feel embarrassed if you go to the hospital with false labor. °Sometimes, the only way to tell if you are in true labor is for your health care provider to look for changes in the cervix. The health care provider will do a physical exam and may monitor your contractions. If you are not in true labor, the exam should show that your cervix is not dilating and your water has not broken. °If there are other health problems associated with your pregnancy, it is completely safe for you to be sent home with false labor. You may continue to have Braxton Hicks contractions until you go into true labor. °How to tell the difference between true labor and false labor °True labor °· Contractions last 30-70 seconds. °· Contractions become very regular. °· Discomfort is usually felt in the top of the uterus, and it spreads to the lower abdomen and low back. °· Contractions do not go away with walking. °· Contractions usually become more intense and increase in frequency. °· The cervix dilates and gets thinner. °False labor °· Contractions are usually shorter and not as strong as true labor contractions. °· Contractions are usually irregular. °· Contractions  are often felt in the front of the lower abdomen and in the groin. °· Contractions may go away when you walk around or change positions while lying down. °· Contractions get weaker and are shorter-lasting as time goes on. °· The cervix usually does not dilate or become thin. °Follow these instructions at home: °· Take over-the-counter and prescription medicines only as told by your health care provider. °· Keep up with your usual exercises and follow other instructions from your health care provider. °· Eat and drink lightly if you think you are going into labor. °· If Braxton Hicks contractions are making you uncomfortable: °? Change your position from lying down or resting to walking, or change from walking to resting. °? Sit and rest in a tub of warm water. °? Drink enough fluid to keep your urine pale yellow. Dehydration may cause these contractions. °? Do slow and deep breathing several times an hour. °· Keep all follow-up prenatal visits as told by your health care provider. This is important. °Contact a health care provider if: °· You have a fever. °· You have continuous pain in your abdomen. °Get help right away if: °· Your contractions become stronger, more regular, and closer together. °· You have fluid leaking or gushing from your vagina. °· You pass blood-tinged mucus (bloody show). °· You have bleeding from your vagina. °· You have low back pain that you never had before. °· You feel your baby’s head pushing down and causing pelvic pressure. °· Your baby is not moving inside you as much as it used to. °Summary °· Contractions that occur before labor are called Braxton   Hicks contractions, false labor, or practice contractions. °· Braxton Hicks contractions are usually shorter, weaker, farther apart, and less regular than true labor contractions. True labor contractions usually become progressively stronger and regular and they become more frequent. °· Manage discomfort from Braxton Hicks contractions by  changing position, resting in a warm bath, drinking plenty of water, or practicing deep breathing. °This information is not intended to replace advice given to you by your health care provider. Make sure you discuss any questions you have with your health care provider. °Document Released: 07/25/2016 Document Revised: 07/25/2016 Document Reviewed: 07/25/2016 °Elsevier Interactive Patient Education © 2018 [employer] ° °

## 2017-11-12 NOTE — Progress Notes (Signed)
   PRENATAL VISIT NOTE  Subjective:  Melanie Warner is a 37 y.o. G2P1001 at 3776w4d being seen today for ongoing prenatal care.  She is currently monitored for the following issues for this low-risk pregnancy and has Supervision of other normal pregnancy, antepartum; Breast lump; AMA (advanced maternal age) multigravida 35+; Bilateral nephrolithiasis; and Pyelonephritis affecting pregnancy in second trimester on their problem list.  Patient reports no complaints.  Contractions: Irritability. Vag. Bleeding: None.  Movement: Present. Denies leaking of fluid.   The following portions of the patient's history were reviewed and updated as appropriate: allergies, current medications, past family history, past medical history, past social history, past surgical history and problem list. Problem list updated.  Objective:   Vitals:   11/12/17 1342  BP: 118/74  Pulse: (!) 10  Weight: 169 lb (76.7 kg)    Fetal Status: Fetal Heart Rate (bpm): 154 Fundal Height: 35 cm Movement: Present     General:  Alert, oriented and cooperative. Patient is in no acute distress.  Skin: Skin is warm and dry. No rash noted.   Cardiovascular: Normal heart rate noted  Respiratory: Normal respiratory effort, no problems with respiration noted  Abdomen: Soft, gravid, appropriate for gestational age.  Pain/Pressure: Absent     Pelvic: Cervical exam deferred        Extremities: Normal range of motion.  Edema: Trace  Mental Status: Normal mood and affect. Normal behavior. Normal judgment and thought content.   Assessment and Plan:  Pregnancy: G2P1001 at 4276w4d  1. Encounter for supervision of other normal pregnancy in third trimester --No concerns or complaints, GBS/GCC next visit --Medicaid pending, not able to sign BTL consent, plan for interval BTL when she gets married and is covered under husband's insurance  Preterm labor symptoms and general obstetric precautions including but not limited to vaginal  bleeding, contractions, leaking of fluid and fetal movement were reviewed in detail with the patient. Please refer to After Visit Summary for other counseling recommendations.  Return in about 1 week (around 11/19/2017).  Future Appointments  Date Time Provider Department Center  11/19/2017  2:15 PM Sharyon Cableogers, Veronica C, CNM [facility]    Calvert CantorSamantha C [place], [place]

## 2017-11-19 ENCOUNTER — Encounter: Payer: Self-pay | Admitting: Certified Nurse Midwife

## 2017-11-20 ENCOUNTER — Ambulatory Visit (INDEPENDENT_AMBULATORY_CARE_PROVIDER_SITE_OTHER): Payer: Self-pay | Admitting: Student

## 2017-11-20 VITALS — BP 114/72 | HR 105 | Wt 169.7 lb

## 2017-11-20 DIAGNOSIS — Z113 Encounter for screening for infections with a predominantly sexual mode of transmission: Secondary | ICD-10-CM

## 2017-11-20 DIAGNOSIS — O2302 Infections of kidney in pregnancy, second trimester: Secondary | ICD-10-CM

## 2017-11-20 DIAGNOSIS — Z3483 Encounter for supervision of other normal pregnancy, third trimester: Secondary | ICD-10-CM

## 2017-11-20 LAB — OB RESULTS CONSOLE GC/CHLAMYDIA: Gonorrhea: NEGATIVE

## 2017-11-20 LAB — OB RESULTS CONSOLE GBS: STREP GROUP B AG: NEGATIVE

## 2017-11-20 MED ORDER — CEPHALEXIN 500 MG PO CAPS: 500.0000 mg | ORAL_CAPSULE | Freq: Every day | ORAL | 0 refills | Status: DC

## 2017-11-20 NOTE — Progress Notes (Signed)
   PRENATAL VISIT NOTE  Subjective:  Melanie Warner is a 37 y.o. G2P1001 at 8029w5d being seen today for ongoing prenatal care.  She is currently monitored for the following issues for this high-risk pregnancy and has Supervision of other normal pregnancy, antepartum; Breast lump; AMA (advanced maternal age) multigravida 35+; Bilateral nephrolithiasis; and Pyelonephritis affecting pregnancy in second trimester on their problem list.  Patient reports trouble sleeping for the last few weeks. Reports she can't "turn her mind off". Discussed use of OTC supplements (melatonin, benadryl, unisom) & good sleep hygiene. .  Contractions: Irritability. Vag. Bleeding: None.  Movement: Present. Denies leaking of fluid.   The following portions of the patient's history were reviewed and updated as appropriate: allergies, current medications, past family history, past medical history, past social history, past surgical history and problem list. Problem list updated.  Objective:   Vitals:   11/20/17 1423  BP: 114/72  Pulse: (!) 105  Weight: 169 lb 11.2 oz (77 kg)    Fetal Status: Fetal Heart Rate (bpm): 134 Fundal Height: 35 cm Movement: Present  Presentation: Vertex  General:  Alert, oriented and cooperative. Patient is in no acute distress.  Skin: Skin is warm and dry. No rash noted.   Cardiovascular: Normal heart rate noted  Respiratory: Normal respiratory effort, no problems with respiration noted  Abdomen: Soft, gravid, appropriate for gestational age.  Pain/Pressure: Absent     Pelvic: Cervical exam performed Dilation: Fingertip Effacement (%): Thick Station: -3  Extremities: Normal range of motion.  Edema: Trace  Mental Status: Normal mood and affect. Normal behavior. Normal judgment and thought content.   Assessment and Plan:  Pregnancy: G2P1001 at 4729w5d  1. Encounter for supervision of other normal pregnancy in third trimester  - Culture, beta strep (group b only) - GC/Chlamydia  [facility]  2. Pyelonephritis affecting pregnancy in second trimester -Patient taking macrobid for suppression; switched to keflex now that she is term. Will collect urine culture today -Pt asymptomatic - Culture, OB Urine - cephALEXin (KEFLEX) 500 MG capsule; Take 1 capsule (500 mg total) by mouth at bedtime.  Dispense: 30 capsule; Refill: 0  Term labor symptoms and general obstetric precautions including but not limited to vaginal bleeding, contractions, leaking of fluid and fetal movement were reviewed in detail with the patient. Please refer to After Visit Summary for other counseling recommendations.  Return in about 1 week (around 11/27/2017) for Routine OB.  No future appointments.  Judeth HornErin Fae Blossom, NP

## 2017-11-20 NOTE — Patient Instructions (Signed)

## 2017-11-21 LAB — GC/CHLAMYDIA PROBE AMP (~~LOC~~) NOT AT ARMC
CHLAMYDIA, DNA PROBE: NEGATIVE
Neisseria Gonorrhea: NEGATIVE

## 2017-11-22 LAB — URINE CULTURE, OB REFLEX

## 2017-11-22 LAB — CULTURE, OB URINE

## 2017-11-26 LAB — CULTURE, BETA STREP (GROUP B ONLY): Strep Gp B Culture: NEGATIVE

## 2017-11-28 ENCOUNTER — Ambulatory Visit: Payer: Self-pay | Admitting: Clinical

## 2017-11-28 ENCOUNTER — Ambulatory Visit (INDEPENDENT_AMBULATORY_CARE_PROVIDER_SITE_OTHER): Payer: Self-pay | Admitting: Obstetrics and Gynecology

## 2017-11-28 VITALS — BP 112/75 | HR 105 | Wt 177.0 lb

## 2017-11-28 DIAGNOSIS — Z3483 Encounter for supervision of other normal pregnancy, third trimester: Secondary | ICD-10-CM

## 2017-11-28 DIAGNOSIS — F5102 Adjustment insomnia: Secondary | ICD-10-CM | POA: Insufficient documentation

## 2017-11-28 DIAGNOSIS — O2302 Infections of kidney in pregnancy, second trimester: Secondary | ICD-10-CM

## 2017-11-28 DIAGNOSIS — R4589 Other symptoms and signs involving emotional state: Secondary | ICD-10-CM

## 2017-11-28 DIAGNOSIS — O2303 Infections of kidney in pregnancy, third trimester: Secondary | ICD-10-CM

## 2017-11-28 DIAGNOSIS — F4323 Adjustment disorder with mixed anxiety and depressed mood: Secondary | ICD-10-CM

## 2017-11-28 DIAGNOSIS — Z3480 Encounter for supervision of other normal pregnancy, unspecified trimester: Secondary | ICD-10-CM

## 2017-11-28 MED ORDER — ZOLPIDEM TARTRATE 5 MG PO TABS: 5.0000 mg | ORAL_TABLET | Freq: Every evening | ORAL | 0 refills | Status: DC | PRN

## 2017-11-28 NOTE — BH Specialist Note (Signed)
Integrated Behavioral Health Initial Visit  MRN: [id number] Name: [facility]  Number of Integrated Behavioral Health Clinician visits:: 1/6 2 Total Session Start time: 3:10  Session End time: 3:15 Total time: 5 minutes  Type of Service: Integrated Behavioral Health- Individual/Family Interpretor:No. Interpretor Name and Language: n/a   Warm Hand Off Completed.       SUBJECTIVE: Melanie Warner is a 37 y.o. female accompanied by 4yo daughter Patient was referred by Venia Carbon, NP for depression. Patient reports the following symptoms/concerns: Pt states her primary concern today is insomnia, with worry and anxiety keeping her up at night, and feeling exhausted upon waking. Pt took Zoloft "a few times" early on in pregnancy, and is open to taking again.  Duration of problem: Increase in pregnancy; Severity of problem: moderate  OBJECTIVE: Mood: Anxious and Affect: Appropriate Risk of harm to self or others: No plan to harm self or others  LIFE CONTEXT: Family and Social: Pt lives with FOB and  4yo daughter School/Work: - Self-Care: - Life Changes: Current pregnancy  GOALS ADDRESSED: Patient will: 1. Reduce symptoms of: anxiety, depression and insomnia 2. Increase knowledge and/or ability of: healthy habits  3. Demonstrate ability to: Improve medication compliance  INTERVENTIONS: Interventions utilized: Supportive Counseling  Standardized Assessments completed: GAD-7 and PHQ 9  ASSESSMENT: Patient currently experiencing Adjustment disorder with mixed anxious and depressed mood   Patient may benefit from continued brief therapeutic interventions regarding coping with symptoms of anxiety and depressed mood.  PLAN: 1. Follow up with behavioral health clinician on : One week 2. Behavioral recommendations:  -Begin taking medication, as prescribed by medical provider today 3. Referral(s): Integrated Behavioral Health Services (In Clinic) 4. "From  scale of 1-10, how likely are you to follow plan?": -  Rae Lips, LCSW  Depression screen [facility] 2/9 11/28/2017 11/20/2017 11/12/2017 10/29/2017 07/03/2017  Decreased Interest 2 2 2 2 1   Down, Depressed, Hopeless 1 1 1 1  0  PHQ - 2 Score 3 3 3 3 1   Altered sleeping 2 3 3 1 1   Tired, decreased energy 2 3 3 3 1   Change in appetite 1 1 1  0 1  Feeling bad or failure about yourself  0 0 1 0 1  Trouble concentrating 1 1 1 1  0  Moving slowly or fidgety/restless 1 1 1 1  0  Suicidal thoughts 0 0 1 0 0  PHQ-9 Score 10 12 14 9 5    GAD 7 : Generalized Anxiety Score 11/28/2017 11/20/2017 11/12/2017 10/29/2017  Nervous, Anxious, on Edge 2 2 1 1   Control/stop worrying 2 2 2 1   Worry too much - different things 2 3 2 1   Trouble relaxing 2 3 3 2   Restless 2 3 2 1   Easily annoyed or irritable 2 1 3 2   Afraid - awful might happen 2 1 1  0  Total GAD 7 Score 14 15 14  8

## 2017-11-28 NOTE — Progress Notes (Signed)
Pt states has been having contractions on & off, and pain in lower back.Pt has a lot of swelling in feet.Pt also states was given pain medicine for kidney stones.

## 2017-11-28 NOTE — Progress Notes (Signed)
   PRENATAL VISIT NOTE  Subjective:  Melanie Warner is a 37 y.o. G2P1001 at [redacted]w[redacted]d being seen today for ongoing prenatal care.  She is currently monitored for the following issues for this low-risk pregnancy and has Supervision of other normal pregnancy, antepartum; Breast lump; AMA (advanced maternal age) multigravida 35+; Bilateral nephrolithiasis; Pyelonephritis affecting pregnancy in second trimester; and Insomnia due to stress on their problem list.  Patient reports insomnia due to worry.  Contractions: Irritability. Vag. Bleeding: None.  Movement: Present. Denies leaking of fluid.   The following portions of the patient's history were reviewed and updated as appropriate: allergies, current medications, past family history, past medical history, past social history, past surgical history and problem list. Problem list updated.  Objective:   Vitals:   11/28/17 1444  BP: 112/75  Pulse: (!) 105  Weight: 177 lb (80.3 kg)    Fetal Status: Fetal Heart Rate (bpm): 159 Fundal Height: 37 cm Movement: Present     General:  Alert, oriented and cooperative. Patient is in no acute distress.  Skin: Skin is warm and dry. No rash noted.   Cardiovascular: Normal heart rate noted  Respiratory: Normal respiratory effort, no problems with respiration noted  Abdomen: Soft, gravid, appropriate for gestational age.  Pain/Pressure: Present     Pelvic: Cervical exam deferred        Extremities: Normal range of motion.  Edema: Mild pitting, slight indentation  Mental Status: Normal mood and affect. Normal behavior. Normal judgment and thought content.   Assessment and Plan:  Pregnancy: G2P1001 at [redacted]w[redacted]d  1. Feeling sad  - Ambulatory referral to Integrated Behavioral Health - Worrying about things at night.  No thoughts of harm to self or anyone else. Has an Rx for Zoloft however is taking it sporadic. Will start taking it daily. Patient to see Asher Muir at next visit.   2. Supervision of other  normal pregnancy, antepartum  GBS negative   3. Insomnia due to stress  Rx Ambien # 5 no refill   4. Pyelonephritis affecting pregnancy in second trimester  Continue Keflex for suppression   Other orders - Prenatal Vit-Fe Fumarate-FA (PRENATAL MULTIVITAMIN) TABS tablet; Take 1 tablet by mouth daily at 12 noon. - zolpidem (AMBIEN) 5 MG tablet; Take 1 tablet (5 mg total) by mouth at bedtime as needed for up to 5 doses for sleep.  Term labor symptoms and general obstetric precautions including but not limited to vaginal bleeding, contractions, leaking of fluid and fetal movement were reviewed in detail with the patient. Please refer to After Visit Summary for other counseling recommendations.  Return in about 1 week (around 12/05/2017).  Future Appointments  Date Time Provider Department Center  12/04/2017  9:15 AM Briant Sites [facility]  12/11/2017  9:15 AM Marny Lowenstein, PA-C [facility]    Venia Carbon, NP

## 2017-12-04 ENCOUNTER — Encounter: Payer: Self-pay | Admitting: Advanced Practice Midwife

## 2017-12-11 ENCOUNTER — Encounter: Payer: Self-pay | Admitting: Medical

## 2017-12-12 ENCOUNTER — Other Ambulatory Visit: Payer: Self-pay

## 2017-12-12 ENCOUNTER — Encounter (HOSPITAL_COMMUNITY): Payer: Self-pay | Admitting: *Deleted

## 2017-12-12 ENCOUNTER — Inpatient Hospital Stay (HOSPITAL_COMMUNITY)
Admission: AD | Admit: 2017-12-12 | Discharge: 2017-12-12 | Disposition: A | Discharge status: Home / Self Care | Payer: Self-pay | Source: Ambulatory Visit | Attending: Obstetrics & Gynecology | Admitting: Obstetrics & Gynecology

## 2017-12-12 DIAGNOSIS — F5102 Adjustment insomnia: Secondary | ICD-10-CM

## 2017-12-12 DIAGNOSIS — Z3480 Encounter for supervision of other normal pregnancy, unspecified trimester: Secondary | ICD-10-CM

## 2017-12-12 DIAGNOSIS — Z3483 Encounter for supervision of other normal pregnancy, third trimester: Secondary | ICD-10-CM | POA: Insufficient documentation

## 2017-12-12 DIAGNOSIS — O2302 Infections of kidney in pregnancy, second trimester: Secondary | ICD-10-CM

## 2017-12-12 DIAGNOSIS — Z3A40 40 weeks gestation of pregnancy: Secondary | ICD-10-CM | POA: Insufficient documentation

## 2017-12-12 NOTE — Progress Notes (Signed)
I have communicated with Melanie Warner, Melanie Warner and reviewed vital signs:  Vitals:   12/12/17 1629 12/12/17 1714  BP: 116/74 121/76  Pulse: (!) 104 100  Resp: 17   Temp: 97.9 F (36.6 C)   SpO2: 99%     Vaginal exam:  Dilation: 3 Effacement (%): Thick Cervical Position: Posterior Station: -3 Presentation: Vertex Exam by:: A. Naelle Diegel, RN,   Also reviewed contraction pattern and that non-stress test is reactive.  It has been documented that patient is contracting every 8-6322minutes not indicating active labor.  Patient denies any other complaints.  Based on this report provider has given order for discharge.  A discharge order and diagnosis entered by a provider.   Labor discharge instructions reviewed with patient.

## 2017-12-12 NOTE — Discharge Instructions (Signed)
Braxton Hicks Contractions °Contractions of the uterus can occur throughout pregnancy, but they are not always a sign that you are in labor. You may have practice contractions called Braxton Hicks contractions. These false labor contractions are sometimes confused with true labor. °What are Braxton Hicks contractions? °Braxton Hicks contractions are tightening movements that occur in the muscles of the uterus before labor. Unlike true labor contractions, these contractions do not result in opening (dilation) and thinning of the cervix. Toward the end of pregnancy (32-34 weeks), Braxton Hicks contractions can happen more often and may become stronger. These contractions are sometimes difficult to tell apart from true labor because they can be very uncomfortable. You should not feel embarrassed if you go to the hospital with false labor. °Sometimes, the only way to tell if you are in true labor is for your health care provider to look for changes in the cervix. The health care provider will do a physical exam and may monitor your contractions. If you are not in true labor, the exam should show that your cervix is not dilating and your water has not broken. °If there are other health problems associated with your pregnancy, it is completely safe for you to be sent home with false labor. You may continue to have Braxton Hicks contractions until you go into true labor. °How to tell the difference between true labor and false labor °True labor °· Contractions last 30-70 seconds. °· Contractions become very regular. °· Discomfort is usually felt in the top of the uterus, and it spreads to the lower abdomen and low back. °· Contractions do not go away with walking. °· Contractions usually become more intense and increase in frequency. °· The cervix dilates and gets thinner. °False labor °· Contractions are usually shorter and not as strong as true labor contractions. °· Contractions are usually irregular. °· Contractions  are often felt in the front of the lower abdomen and in the groin. °· Contractions may go away when you walk around or change positions while lying down. °· Contractions get weaker and are shorter-lasting as time goes on. °· The cervix usually does not dilate or become thin. °Follow these instructions at home: °· Take over-the-counter and prescription medicines only as told by your health care provider. °· Keep up with your usual exercises and follow other instructions from your health care provider. °· Eat and drink lightly if you think you are going into labor. °· If Braxton Hicks contractions are making you uncomfortable: °? Change your position from lying down or resting to walking, or change from walking to resting. °? Sit and rest in a tub of warm water. °? Drink enough fluid to keep your urine pale yellow. Dehydration may cause these contractions. °? Do slow and deep breathing several times an hour. °· Keep all follow-up prenatal visits as told by your health care provider. This is important. °Contact a health care provider if: °· You have a fever. °· You have continuous pain in your abdomen. °Get help right away if: °· Your contractions become stronger, more regular, and closer together. °· You have fluid leaking or gushing from your vagina. °· You pass blood-tinged mucus (bloody show). °· You have bleeding from your vagina. °· You have low back pain that you never had before. °· You feel your baby’s head pushing down and causing pelvic pressure. °· Your baby is not moving inside you as much as it used to. °Summary °· Contractions that occur before labor are called Braxton   Hicks contractions, false labor, or practice contractions. °· Braxton Hicks contractions are usually shorter, weaker, farther apart, and less regular than true labor contractions. True labor contractions usually become progressively stronger and regular and they become more frequent. °· Manage discomfort from Braxton Hicks contractions by  changing position, resting in a warm bath, drinking plenty of water, or practicing deep breathing. °This information is not intended to replace advice given to you by your health care provider. Make sure you discuss any questions you have with your health care provider. °Document Released: 07/25/2016 Document Revised: 07/25/2016 Document Reviewed: 07/25/2016 °Elsevier Interactive Patient Education © 2018 [employer] ° °Fetal Movement Counts °Patient Name: ________________________________________________ Patient Due Date: ____________________ °What is a fetal movement count? °A fetal movement count is the number of times that you feel your baby move during a certain amount of time. This may also be called a fetal kick count. A fetal movement count is recommended for every pregnant woman. You may be asked to start counting fetal movements as early as week 28 of your pregnancy. °Pay attention to when your baby is most active. You may notice your baby's sleep and wake cycles. You may also notice things that make your baby move more. You should do a fetal movement count: °· When your baby is normally most active. °· At the same time each day. ° °A good time to count movements is while you are resting, after having something to eat and drink. °How do I count fetal movements? °1. Find a quiet, comfortable area. Sit, or lie down on your side. °2. Write down the date, the start time and stop time, and the number of movements that you felt between those two times. Take this information with you to your health care visits. °3. For 2 hours, count kicks, flutters, swishes, rolls, and jabs. You should feel at least 10 movements during 2 hours. °4. You may stop counting after you have felt 10 movements. °5. If you do not feel 10 movements in 2 hours, have something to eat and drink. Then, keep resting and counting for 1 hour. If you feel at least 4 movements during that hour, you may stop counting. °Contact a health care  provider if: °· You feel fewer than 4 movements in 2 hours. °· Your baby is not moving like he or she usually does. °Date: ____________ Start time: ____________ Stop time: ____________ Movements: ____________ °Date: ____________ Start time: ____________ Stop time: ____________ Movements: ____________ °Date: ____________ Start time: ____________ Stop time: ____________ Movements: ____________ °Date: ____________ Start time: ____________ Stop time: ____________ Movements: ____________ °Date: ____________ Start time: ____________ Stop time: ____________ Movements: ____________ °Date: ____________ Start time: ____________ Stop time: ____________ Movements: ____________ °Date: ____________ Start time: ____________ Stop time: ____________ Movements: ____________ °Date: ____________ Start time: ____________ Stop time: ____________ Movements: ____________ °Date: ____________ Start time: ____________ Stop time: ____________ Movements: ____________ °This information is not intended to replace advice given to you by your health care provider. Make sure you discuss any questions you have with your health care provider. °Document Released: 04/10/2006 Document Revised: 11/08/2015 Document Reviewed: 04/20/2015 °Elsevier Interactive Patient Education © 2018 [employer] ° °

## 2017-12-12 NOTE — MAU Note (Signed)
Started feeling sick when first woke up.. Started contracting after that.  Now every 8 min.  ? Leaking, little bit of fluid. No bleeding.

## 2017-12-15 ENCOUNTER — Ambulatory Visit (INDEPENDENT_AMBULATORY_CARE_PROVIDER_SITE_OTHER): Payer: Self-pay | Admitting: Student

## 2017-12-15 ENCOUNTER — Other Ambulatory Visit: Payer: Self-pay | Admitting: Student

## 2017-12-15 ENCOUNTER — Telehealth (HOSPITAL_COMMUNITY): Payer: Self-pay | Admitting: *Deleted

## 2017-12-15 ENCOUNTER — Encounter: Payer: Self-pay | Admitting: Family Medicine

## 2017-12-15 ENCOUNTER — Ambulatory Visit: Payer: Self-pay

## 2017-12-15 ENCOUNTER — Encounter (HOSPITAL_COMMUNITY): Payer: Self-pay | Admitting: *Deleted

## 2017-12-15 ENCOUNTER — Encounter: Payer: Self-pay | Admitting: *Deleted

## 2017-12-15 VITALS — BP 110/86 | HR 95 | Wt 178.0 lb

## 2017-12-15 DIAGNOSIS — O480 Post-term pregnancy: Secondary | ICD-10-CM

## 2017-12-15 DIAGNOSIS — Z3480 Encounter for supervision of other normal pregnancy, unspecified trimester: Secondary | ICD-10-CM

## 2017-12-15 DIAGNOSIS — Z3483 Encounter for supervision of other normal pregnancy, third trimester: Secondary | ICD-10-CM

## 2017-12-15 NOTE — Progress Notes (Addendum)
   PRENATAL VISIT NOTE  Subjective:  Melanie Warner is a 37 y.o. G2P1001 at 3621w2d being seen today for ongoing prenatal care.  She is currently monitored for the following issues for this low-risk pregnancy and has Supervision of other normal pregnancy, antepartum; Breast lump; AMA (advanced maternal age) multigravida 35+; Bilateral nephrolithiasis; Pyelonephritis affecting pregnancy in second trimester; and Insomnia due to stress on their problem list.  Patient reports no complaints.  Contractions: Irritability. Vag. Bleeding: None.  Movement: Present. Denies leaking of fluid.   The following portions of the patient's history were reviewed and updated as appropriate: allergies, current medications, past family history, past medical history, past social history, past surgical history and problem list. Problem list updated.  Objective:   Vitals:   12/15/17 1348  BP: 110/86  Pulse: 95  Weight: 178 lb (80.7 kg)    Fetal Status:     Movement: Present     Fundal height of 40 cm.   General:  Alert, oriented and cooperative. Patient is in no acute distress.  Skin: Skin is warm and dry. No rash noted.   Cardiovascular: Normal heart rate noted  Respiratory: Normal respiratory effort, no problems with respiration noted  Abdomen: Soft, gravid, appropriate for gestational age.  Pain/Pressure: Present     Pelvic: Cervical exam deferred        Extremities: Normal range of motion.  Edema: Deep pitting, indentation remains for a short time  Mental Status: Normal mood and affect. Normal behavior. Normal judgment and thought content.   Assessment and Plan:  Pregnancy: G2P1001 at 5521w2d  1. Supervision of other normal pregnancy, antepartum -Discussed signs of labor onset such as frequent, painful contractions and fluid leakage.  -Patient is still working on the paperwork for BTL. Would like to use POP until the surgery is scheduled.   2. Post term pregnancy, antepartum condition or  complication -Inductions scheduled for 12/20/17 at 0700. Discussed where to go on the induction day.  - US FETAL BPP W/NONSTRESS; Future -NST today.   Term labor symptoms and general obstetric precautions including but not limited to vaginal bleeding, contractions, leaking of fluid and fetal movement were reviewed in detail with the patient. Please refer to After Visit Summary for other counseling recommendations.  No follow-ups on file.  Future Appointments  Date Time Provider Department Center  12/20/2017  7:00 AM [facility] ROOM [facility] None    Charyl DancerErin Wilfred Siverson, [place]   I confirm that I have verified the information documented in the physician assistant's note and that I have also personally reperformed the physical exam and all medical decision making activities.   Luna KitchensKathryn Kooistra

## 2017-12-15 NOTE — Telephone Encounter (Signed)
Preadmission screen  

## 2017-12-15 NOTE — Progress Notes (Signed)

## 2017-12-16 ENCOUNTER — Encounter (HOSPITAL_COMMUNITY): Payer: Self-pay

## 2017-12-16 ENCOUNTER — Other Ambulatory Visit: Payer: Self-pay

## 2017-12-16 ENCOUNTER — Inpatient Hospital Stay (HOSPITAL_COMMUNITY)
Admission: AD | Admit: 2017-12-16 | Discharge: 2017-12-21 | DRG: 786 | Disposition: A | Discharge status: Home / Self Care | Payer: Medicaid Other | Attending: Obstetrics and Gynecology | Admitting: Obstetrics and Gynecology

## 2017-12-16 DIAGNOSIS — Z3A40 40 weeks gestation of pregnancy: Secondary | ICD-10-CM

## 2017-12-16 DIAGNOSIS — O411230 Chorioamnionitis, third trimester, not applicable or unspecified: Secondary | ICD-10-CM | POA: Diagnosis present

## 2017-12-16 DIAGNOSIS — N200 Calculus of kidney: Secondary | ICD-10-CM

## 2017-12-16 DIAGNOSIS — O324XX0 Maternal care for high head at term, not applicable or unspecified: Secondary | ICD-10-CM | POA: Diagnosis present

## 2017-12-16 DIAGNOSIS — F5102 Adjustment insomnia: Secondary | ICD-10-CM

## 2017-12-16 DIAGNOSIS — O339 Maternal care for disproportion, unspecified: Secondary | ICD-10-CM | POA: Diagnosis present

## 2017-12-16 DIAGNOSIS — Z98891 History of uterine scar from previous surgery: Secondary | ICD-10-CM

## 2017-12-16 DIAGNOSIS — O2302 Infections of kidney in pregnancy, second trimester: Secondary | ICD-10-CM

## 2017-12-16 DIAGNOSIS — O480 Post-term pregnancy: Principal | ICD-10-CM | POA: Diagnosis present

## 2017-12-16 DIAGNOSIS — O09529 Supervision of elderly multigravida, unspecified trimester: Secondary | ICD-10-CM

## 2017-12-16 DIAGNOSIS — Z3480 Encounter for supervision of other normal pregnancy, unspecified trimester: Secondary | ICD-10-CM

## 2017-12-16 LAB — POCT FERN TEST: POCT Fern Test: NEGATIVE

## 2017-12-16 LAB — CBC
HCT: 33.1 % — ABNORMAL LOW (ref 36.0–46.0)
HEMOGLOBIN: 10.7 g/dL — AB (ref 12.0–15.0)
MCH: 27 pg (ref 26.0–34.0)
MCHC: 32.3 g/dL (ref 30.0–36.0)
MCV: 83.4 fL (ref 78.0–100.0)
Platelets: 273 10*3/uL (ref 150–400)
RBC: 3.97 MIL/uL (ref 3.87–5.11)
RDW: 14.7 % (ref 11.5–15.5)
WBC: 10.8 10*3/uL — ABNORMAL HIGH (ref 4.0–10.5)

## 2017-12-16 MED ORDER — OXYTOCIN BOLUS FROM INFUSION: 500.0000 mL | Freq: Once | INTRAVENOUS | Status: DC

## 2017-12-16 MED ORDER — TERBUTALINE SULFATE 1 MG/ML IJ SOLN: 0.2500 mg | Freq: Once | INTRAMUSCULAR | Status: DC | PRN

## 2017-12-16 MED ORDER — LIDOCAINE HCL (PF) 1 % IJ SOLN: 30.0000 mL | INTRAMUSCULAR | Status: DC | PRN

## 2017-12-16 MED ORDER — LACTATED RINGERS IV SOLN: 500.0000 mL | INTRAVENOUS | Status: DC | PRN

## 2017-12-16 MED ORDER — LACTATED RINGERS IV SOLN
INTRAVENOUS | Status: DC
  Administered 2017-12-16 (×2): via INTRAVENOUS

## 2017-12-16 MED ORDER — LACTATED RINGERS IV SOLN
INTRAVENOUS | Status: DC
  Administered 2017-12-17 (×2): via INTRAVENOUS

## 2017-12-16 MED ORDER — OXYTOCIN 40 UNITS IN LACTATED RINGERS INFUSION - SIMPLE MED: 2.5000 [IU]/h | INTRAVENOUS | Status: DC

## 2017-12-16 MED ORDER — OXYTOCIN 40 UNITS IN LACTATED RINGERS INFUSION - SIMPLE MED: 1.0000 m[IU]/min | INTRAVENOUS | Status: DC

## 2017-12-16 MED ORDER — ACETAMINOPHEN 325 MG PO TABS
650.0000 mg | ORAL_TABLET | ORAL | Status: DC | PRN
  Filled 2017-12-16: qty 2

## 2017-12-16 MED ORDER — ONDANSETRON HCL 4 MG/2ML IJ SOLN
4.0000 mg | Freq: Four times a day (QID) | INTRAMUSCULAR | Status: DC | PRN
  Administered 2017-12-17 – 2017-12-18 (×3): 4 mg via INTRAVENOUS
  Filled 2017-12-16 (×2): qty 2

## 2017-12-16 MED ORDER — OXYTOCIN 40 UNITS IN LACTATED RINGERS INFUSION - SIMPLE MED
1.0000 m[IU]/min | INTRAVENOUS | Status: DC
  Administered 2017-12-17: 2 m[IU]/min via INTRAVENOUS
  Filled 2017-12-16: qty 1000

## 2017-12-16 MED ORDER — OXYCODONE-ACETAMINOPHEN 5-325 MG PO TABS: 1.0000 | ORAL_TABLET | ORAL | Status: DC | PRN

## 2017-12-16 MED ORDER — SOD CITRATE-CITRIC ACID 500-334 MG/5ML PO SOLN
30.0000 mL | ORAL | Status: DC | PRN
  Administered 2017-12-17: 30 mL via ORAL
  Filled 2017-12-16 (×2): qty 15

## 2017-12-16 MED ORDER — OXYCODONE-ACETAMINOPHEN 5-325 MG PO TABS: 2.0000 | ORAL_TABLET | ORAL | Status: DC | PRN

## 2017-12-16 NOTE — H&P (Signed)
Melanie Warner is a 37 y.o. 632P1001 female at 1926w3d by LMP presenting to MAU initially for r/o ROM, determined to be intact, however had prolonged decel x 3mins- so will be admitted for induction for postdates/decel .   Reports active fetal movement, contractions: irregular, vaginal bleeding: none, membranes: intact. [facility]   Most recent u/s yesterday- had BPP d/t postdates, was 6/8 (-2 for breathing) w/ reactive NST.   This pregnancy complicated by: Pyelonephritis and bilateral nephrolithiasis  AMA 37yo  Prenatal History/Complications:  Term SVB x 1  Past Medical History: Past Medical History:  Diagnosis Date  . Chronic kidney disease    kidney infection with first pregnancy  . Depression   . Hx of pyelonephritis during pregnancy 2015, 2019    Past Surgical History: Past Surgical History:  Procedure Laterality Date  . BREAST LUMPECTOMY  2007  . BREAST SURGERY      Obstetrical History: OB History    Gravida  2   Para  1   Term  1   Preterm      AB      Living  1     SAB      TAB      Ectopic      Multiple      Live Births  1           Social History: Social History   Socioeconomic History  . Marital status: Single    Spouse name: Bevelyn NgoStephen Reeder  . Number of children: Not on file  . Years of education: Not on file  . Highest education level: Not on file  Occupational History  . Occupation: unemployed  Social Needs  . Financial resource strain: Not hard at all  . Food insecurity:    Worry: Never true    Inability: Never true  . Transportation needs:    Medical: No    Non-medical: No  Tobacco Use  . Smoking status: Never Smoker  . Smokeless tobacco: Never Used  Substance and Sexual Activity  . Alcohol use: No  . Drug use: No  . Sexual activity: Not Currently    Birth control/protection: None  Lifestyle  . Physical activity:    Days per week: 0 days    Minutes per session: 0 min  . Stress:  Only a little  Relationships  . Social connections:    Talks on phone: Three times a week    Gets together: More than three times a week    Attends religious service: Never    Active member of club or organization: No    Attends meetings of clubs or organizations: Never    Relationship status: Not on file  Other Topics Concern  . Not on file  Social History Narrative  . Not on file    Family History: Family History  Problem Relation Age of Onset  . Hypertension Father     Allergies: No Known Allergies  Medications Prior to Admission  Medication Sig Dispense Refill Last Dose  . calcium carbonate (OS-CAL) 1250 (500 Ca) MG chewable tablet Chew 1 tablet by mouth daily.   12/15/2017 at Unknown time  . cephALEXin (KEFLEX) 500 MG capsule Take 500 mg by mouth 4 (four) times daily.   12/15/2017 at Unknown time  . Prenatal Vit-Fe Fumarate-FA (PRENATAL MULTIVITAMIN) TABS tablet Take 1 tablet by mouth daily at 12 noon.   12/16/2017 at Unknown time  . zolpidem (AMBIEN) 5 MG tablet Take 1  tablet (5 mg total) by mouth at bedtime as needed for up to 5 doses for sleep. 5 tablet 0 Past Week at Unknown time    Review of Systems  Pertinent pos/neg as indicated in HPI  Blood pressure 116/75, pulse 84, temperature 97.9 F (36.6 C), temperature source Oral, resp. rate 16, height 5\' 4"  (1.626 m), weight 82.9 kg, last menstrual period 03/08/2017, SpO2 99 %, unknown if currently breastfeeding. General appearance: alert, cooperative and no distress Lungs: clear to auscultation bilaterally Heart: regular rate and rhythm Abdomen: gravid, soft, non-tender Extremities: tr edema DTR's 2+  Fetal monitoring: FHR: 135 bpm, variability: moderate,  Accelerations: Present,  decelerations:  Present one prolonged to nadir of 120 Uterine activity: irregular Dilation: 3 Effacement (%): 50 Station: -3 Exam by:: Mjohnson, RN  Presentation: cephalic   Prenatal labs: ABO, Rh: --/--/O POS (09/24  2256) Antibody: NEG (09/24 2256) Rubella: 2.55 (03/14 1417) RPR: Non Reactive (07/31 0920)  HBsAg: Negative (03/14 1417)  HIV: Non Reactive (07/31 0920)  GBS: Negative (08/29 0000)   2hr GTT: normal Genetic screening:  normal Anatomy US: normal  Results for orders placed or performed during the hospital encounter of 12/16/17 (from the past 24 hour(s))  Fern Test   Collection Time: 12/16/17  9:39 PM  Result Value Ref Range   POCT Fern Test Negative = intact amniotic membranes   CBC   Collection Time: 12/16/17 10:56 PM  Result Value Ref Range   WBC 10.8 (H) 4.0 - 10.5 K/uL   RBC 3.97 3.87 - 5.11 MIL/uL   Hemoglobin 10.7 (L) 12.0 - 15.0 g/dL   HCT 16.1 (L) 09.6 - 04.5 %   MCV 83.4 78.0 - 100.0 fL   MCH 27.0 26.0 - 34.0 pg   MCHC 32.3 30.0 - 36.0 g/dL   RDW 40.9 81.1 - 91.4 %   Platelets 273 150 - 400 K/uL  Type and screen [facility]   Collection Time: 12/16/17 10:56 PM  Result Value Ref Range   ABO/RH(D) O POS    Antibody Screen NEG    Sample Expiration      12/19/2017 Performed at [facility], [address] [place], [place] [address]      Assessment:  [redacted]w[redacted]d SIUP  G2P1001  Postdates  Fetal deceleration x 1  Cat 1 FHR currently  GBS Negative (08/29 0000)neg  Plan:  Admit to BS  IV pain meds/epidural prn active labor  Pitocin per protocol  Anticipate NSVB   Plans to breastfeed  Contraception: POPs  Circumcision: possibly outpatient  Cheral Marker CNM, WHNP-BC 12/17/2017, 2:33 AM

## 2017-12-16 NOTE — MAU Note (Signed)
Pt states that this afternoon she was walking in the kitchen and felt wet. After that she felt like her back was hurting.   Denies vaginal bleeding.  Reports +FM

## 2017-12-16 NOTE — MAU Provider Note (Addendum)
History   [id number]   Chief Complaint  Patient presents with  . Rupture of Membranes    HPI Melanie Warner is a 37 y.o. female  G2P1001 @40 .3 wks here with report of feeling something leak out when she stood up around 2:30 this afternoon.  Fluid appeared colorless and odorless. Just a tiny amt of leaking has continued. Pt reports irregular contractions. She denies vaginal bleeding. Last intercourse was not recent. She reports good fetal movement. All other systems negative.    Patient's last menstrual period was 03/08/2017 (exact date).  OB History  Gravida Para Term Preterm AB Living  2 1 1     1   SAB TAB Ectopic Multiple Live Births          1    # Outcome Date GA Lbr Len/2nd Weight Sex Delivery Anes PTL Lv  2 Current           1 Term 11/02/13 [redacted]w[redacted]d  3500 g F Vag-Spont EPI N LIV    Past Medical History:  Diagnosis Date  . Chronic kidney disease    kidney infection with first pregnancy  . Depression   . Hx of pyelonephritis during pregnancy 2015, 2019    Family History  Problem Relation Age of Onset  . Hypertension Father     Social History   Socioeconomic History  . Marital status: Single    Spouse name: Not on file  . Number of children: Not on file  . Years of education: Not on file  . Highest education level: Not on file  Occupational History  . Not on file  Social Needs  . Financial resource strain: Not hard at all  . Food insecurity:    Worry: Never true    Inability: Never true  . Transportation needs:    Medical: No    Non-medical: Not on file  Tobacco Use  . Smoking status: Never Smoker  . Smokeless tobacco: Never Used  Substance and Sexual Activity  . Alcohol use: No  . Drug use: No  . Sexual activity: Not Currently    Birth control/protection: None  Lifestyle  . Physical activity:    Days per week: Not on file    Minutes per session: Not on file  . Stress: Only a little  Relationships  . Social connections:    Talks on  phone: Not on file    Gets together: Not on file    Attends religious service: Not on file    Active member of club or organization: Not on file    Attends meetings of clubs or organizations: Not on file    Relationship status: Not on file  Other Topics Concern  . Not on file  Social History Narrative  . Not on file    No Known Allergies  No current facility-administered medications on file prior to encounter.    Current Outpatient Medications on File Prior to Encounter  Medication Sig Dispense Refill  . Prenatal Vit-Fe Fumarate-FA (PRENATAL MULTIVITAMIN) TABS tablet Take 1 tablet by mouth daily at 12 noon.    Marland Kitchen zolpidem (AMBIEN) 5 MG tablet Take 1 tablet (5 mg total) by mouth at bedtime as needed for up to 5 doses for sleep. 5 tablet 0     Review of Systems  Gastrointestinal: Negative for abdominal pain.  Genitourinary: Positive for vaginal discharge.     Physical Exam   Vitals:   12/16/17 2030 12/16/17 2035  BP:  119/77  Pulse:  94  Resp:  16  Temp:  97.7 F (36.5 C)  SpO2:  99%  Weight: 82.9 kg     Physical Exam  Constitutional: She is oriented to person, place, and time. She appears well-developed and well-nourished. No distress.  HENT:  Head: Normocephalic and atraumatic.  Neck: Normal range of motion.  Cardiovascular: Normal rate.  Respiratory: Effort normal. No respiratory distress.  Genitourinary:  Genitourinary Comments: SSE: +thick white mucous, no pool, fern neg SVE 3/60/-2, vtx  Musculoskeletal: Normal range of motion.  Neurological: She is alert and oriented to person, place, and time.  Skin: Skin is warm and dry.  Psychiatric: She has a normal mood and affect.  EFM: 135 bpm, mod variability, + accels, no decels Toco: irregular  Results for orders placed or performed during the hospital encounter of 12/16/17 (from the past 24 hour(s))  Fern Test     Status: None   Collection Time: 12/16/17  9:39 PM  Result Value Ref Range   POCT Fern Test  Negative = intact amniotic membranes    MAU Course  Procedures  MDM No evidence of labor or SROM. Prolonged FHR decel x3 min then NST reactive again. Will continue observation. Transfer of care given to Aundria Rudogers, CNM  Consult with Dr Adrian BlackwaterStinson for assessment and plan of care. Recommends admission to L&D and induction of labor.  Report given to Labor team and orders placed by team  Assessment and Plan   1. Post-term pregnancy, 40-42 weeks of gestation    Admit to L&D  Care taken over by L&D team  Orders placed by team and report given   Sharyon CableVeronica C Tashaun Obey, CNM 12/16/17, 10:32 PM

## 2017-12-17 ENCOUNTER — Inpatient Hospital Stay (HOSPITAL_COMMUNITY): Payer: Medicaid Other | Admitting: Anesthesiology

## 2017-12-17 ENCOUNTER — Encounter (HOSPITAL_COMMUNITY)
Admission: AD | Disposition: A | Discharge status: Home / Self Care | Payer: Self-pay | Source: Home / Self Care | Attending: Obstetrics and Gynecology

## 2017-12-17 DIAGNOSIS — O480 Post-term pregnancy: Secondary | ICD-10-CM | POA: Diagnosis present

## 2017-12-17 LAB — URINALYSIS, ROUTINE W REFLEX MICROSCOPIC
BILIRUBIN URINE: NEGATIVE
GLUCOSE, UA: NEGATIVE mg/dL
Ketones, ur: NEGATIVE mg/dL
Leukocytes, UA: NEGATIVE
NITRITE: NEGATIVE
PH: 7 (ref 5.0–8.0)
Protein, ur: NEGATIVE mg/dL
SPECIFIC GRAVITY, URINE: 1.016 (ref 1.005–1.030)

## 2017-12-17 LAB — TYPE AND SCREEN
ABO/RH(D): O POS
Antibody Screen: NEGATIVE

## 2017-12-17 LAB — RPR: RPR Ser Ql: NONREACTIVE

## 2017-12-17 LAB — ABO/RH: ABO/RH(D): O POS

## 2017-12-17 SURGERY — Surgical Case
Anesthesia: Epidural

## 2017-12-17 MED ORDER — EPHEDRINE 5 MG/ML INJ: 10.0000 mg | INTRAVENOUS | Status: DC | PRN

## 2017-12-17 MED ORDER — CEPHALEXIN 500 MG PO CAPS
500.0000 mg | ORAL_CAPSULE | Freq: Four times a day (QID) | ORAL | Status: DC
  Filled 2017-12-17 (×2): qty 1

## 2017-12-17 MED ORDER — OXYTOCIN BOLUS FROM INFUSION: 500.0000 mL | Freq: Once | INTRAVENOUS | Status: DC

## 2017-12-17 MED ORDER — FLEET ENEMA 7-19 GM/118ML RE ENEM: 1.0000 | ENEMA | RECTAL | Status: DC | PRN

## 2017-12-17 MED ORDER — DEXAMETHASONE SODIUM PHOSPHATE 4 MG/ML IJ SOLN
INTRAMUSCULAR | Status: AC
  Filled 2017-12-17: qty 1

## 2017-12-17 MED ORDER — LACTATED RINGERS IV SOLN
500.0000 mL | Freq: Once | INTRAVENOUS | Status: AC
  Administered 2017-12-17: 500 mL via INTRAVENOUS

## 2017-12-17 MED ORDER — OXYCODONE-ACETAMINOPHEN 5-325 MG PO TABS: 1.0000 | ORAL_TABLET | ORAL | Status: DC | PRN

## 2017-12-17 MED ORDER — CEFAZOLIN SODIUM-DEXTROSE 2-3 GM-%(50ML) IV SOLR
INTRAVENOUS | Status: DC | PRN
  Administered 2017-12-17: 2 g via INTRAVENOUS

## 2017-12-17 MED ORDER — OXYCODONE-ACETAMINOPHEN 5-325 MG PO TABS: 2.0000 | ORAL_TABLET | ORAL | Status: DC | PRN

## 2017-12-17 MED ORDER — ONDANSETRON HCL 4 MG/2ML IJ SOLN: 4.0000 mg | Freq: Four times a day (QID) | INTRAMUSCULAR | Status: DC | PRN

## 2017-12-17 MED ORDER — ACETAMINOPHEN 325 MG PO TABS: 650.0000 mg | ORAL_TABLET | ORAL | Status: DC | PRN

## 2017-12-17 MED ORDER — OXYTOCIN 40 UNITS IN LACTATED RINGERS INFUSION - SIMPLE MED: 2.5000 [IU]/h | INTRAVENOUS | Status: DC

## 2017-12-17 MED ORDER — SODIUM CHLORIDE 0.9 % IV SOLN
2.0000 g | Freq: Four times a day (QID) | INTRAVENOUS | Status: DC
  Administered 2017-12-17: 2 g via INTRAVENOUS
  Filled 2017-12-17: qty 2000
  Filled 2017-12-17: qty 2

## 2017-12-17 MED ORDER — SODIUM BICARBONATE 8.4 % IV SOLN
INTRAVENOUS | Status: AC
  Filled 2017-12-17: qty 50

## 2017-12-17 MED ORDER — ONDANSETRON HCL 4 MG/2ML IJ SOLN
INTRAMUSCULAR | Status: AC
  Filled 2017-12-17: qty 4

## 2017-12-17 MED ORDER — CEPHALEXIN 500 MG PO CAPS
500.0000 mg | ORAL_CAPSULE | Freq: Every day | ORAL | Status: DC
  Filled 2017-12-17: qty 1

## 2017-12-17 MED ORDER — LACTATED RINGERS IV SOLN
INTRAVENOUS | Status: DC | PRN
  Administered 2017-12-17 – 2017-12-18 (×2): via INTRAVENOUS

## 2017-12-17 MED ORDER — LIDOCAINE HCL (PF) 1 % IJ SOLN: 30.0000 mL | INTRAMUSCULAR | Status: DC | PRN

## 2017-12-17 MED ORDER — DIPHENHYDRAMINE HCL 50 MG/ML IJ SOLN: 12.5000 mg | INTRAMUSCULAR | Status: DC | PRN

## 2017-12-17 MED ORDER — FENTANYL 2.5 MCG/ML BUPIVACAINE 1/10 % EPIDURAL INFUSION (WH - ANES)
14.0000 mL/h | INTRAMUSCULAR | Status: DC | PRN
  Administered 2017-12-17 (×2): 14 mL/h via EPIDURAL
  Filled 2017-12-17 (×2): qty 100

## 2017-12-17 MED ORDER — PHENYLEPHRINE 40 MCG/ML (10ML) SYRINGE FOR IV PUSH (FOR BLOOD PRESSURE SUPPORT): 80.0000 ug | PREFILLED_SYRINGE | INTRAVENOUS | Status: DC | PRN

## 2017-12-17 MED ORDER — OXYTOCIN 10 UNIT/ML IJ SOLN
INTRAMUSCULAR | Status: AC
  Filled 2017-12-17: qty 4

## 2017-12-17 MED ORDER — SODIUM CHLORIDE 0.9 % IV SOLN
500.0000 mg | Freq: Once | INTRAVENOUS | Status: AC
  Administered 2017-12-18: 500 mg via INTRAVENOUS
  Filled 2017-12-17: qty 500

## 2017-12-17 MED ORDER — LIDOCAINE-EPINEPHRINE (PF) 2 %-1:200000 IJ SOLN
INTRAMUSCULAR | Status: AC
  Filled 2017-12-17: qty 20

## 2017-12-17 MED ORDER — SCOPOLAMINE 1 MG/3DAYS TD PT72
MEDICATED_PATCH | TRANSDERMAL | Status: AC
  Filled 2017-12-17: qty 1

## 2017-12-17 MED ORDER — SOD CITRATE-CITRIC ACID 500-334 MG/5ML PO SOLN: 30.0000 mL | ORAL | Status: DC | PRN

## 2017-12-17 MED ORDER — LACTATED RINGERS IV SOLN: 500.0000 mL | INTRAVENOUS | Status: DC | PRN

## 2017-12-17 MED ORDER — LIDOCAINE-EPINEPHRINE (PF) 2 %-1:200000 IJ SOLN
INTRAMUSCULAR | Status: DC | PRN
  Administered 2017-12-17: 4 mL via EPIDURAL
  Administered 2017-12-17: 2 mL via EPIDURAL
  Administered 2017-12-17: 4 mL via EPIDURAL

## 2017-12-17 MED ORDER — ACETAMINOPHEN 500 MG PO TABS
1000.0000 mg | ORAL_TABLET | Freq: Once | ORAL | Status: AC
  Administered 2017-12-17: 1000 mg via ORAL
  Filled 2017-12-17: qty 2

## 2017-12-17 MED ORDER — GENTAMICIN SULFATE 40 MG/ML IJ SOLN
150.0000 mg | Freq: Three times a day (TID) | INTRAVENOUS | Status: DC
  Administered 2017-12-17: 150 mg via INTRAVENOUS
  Filled 2017-12-17 (×3): qty 3.75

## 2017-12-17 MED ORDER — MORPHINE SULFATE (PF) 0.5 MG/ML IJ SOLN
INTRAMUSCULAR | Status: AC
  Filled 2017-12-17: qty 10

## 2017-12-17 MED ORDER — PHENYLEPHRINE 40 MCG/ML (10ML) SYRINGE FOR IV PUSH (FOR BLOOD PRESSURE SUPPORT)
80.0000 ug | PREFILLED_SYRINGE | INTRAVENOUS | Status: DC | PRN
  Filled 2017-12-17: qty 10

## 2017-12-17 MED ORDER — FENTANYL CITRATE (PF) 100 MCG/2ML IJ SOLN
50.0000 ug | INTRAMUSCULAR | Status: DC | PRN
  Administered 2017-12-17: 100 ug via INTRAVENOUS
  Filled 2017-12-17: qty 2

## 2017-12-17 MED ORDER — HYDROXYZINE HCL 50 MG PO TABS: 50.0000 mg | ORAL_TABLET | Freq: Four times a day (QID) | ORAL | Status: DC | PRN

## 2017-12-17 SURGICAL SUPPLY — 41 items
BENZOIN TINCTURE PRP APPL 2/3 (GAUZE/BANDAGES/DRESSINGS) ×3 IMPLANT
CHLORAPREP W/TINT 26ML (MISCELLANEOUS) ×3 IMPLANT
CLAMP CORD UMBIL (MISCELLANEOUS) IMPLANT
CLOSURE WOUND 1/2 X4 (GAUZE/BANDAGES/DRESSINGS) ×1
CLOTH BEACON ORANGE TIMEOUT ST (SAFETY) ×3 IMPLANT
COVER LIGHT HANDLE  1/PK (MISCELLANEOUS) ×4
COVER LIGHT HANDLE 1/PK (MISCELLANEOUS) ×2 IMPLANT
DRSG OPSITE POSTOP 4X10 (GAUZE/BANDAGES/DRESSINGS) ×3 IMPLANT
ELECT REM PT RETURN 9FT ADLT (ELECTROSURGICAL) ×3
ELECTRODE REM PT RTRN 9FT ADLT (ELECTROSURGICAL) ×1 IMPLANT
EXTRACTOR VACUUM KIWI (MISCELLANEOUS) IMPLANT
GLOVE BIOGEL PI IND STRL 7.0 (GLOVE) ×1 IMPLANT
GLOVE BIOGEL PI IND STRL 9 (GLOVE) ×1 IMPLANT
GLOVE BIOGEL PI INDICATOR 7.0 (GLOVE) ×2
GLOVE BIOGEL PI INDICATOR 9 (GLOVE) ×2
GLOVE SS PI 9.0 STRL (GLOVE) ×3 IMPLANT
GOWN STRL REUS W/TWL 2XL LVL3 (GOWN DISPOSABLE) ×3 IMPLANT
GOWN STRL REUS W/TWL LRG LVL3 (GOWN DISPOSABLE) ×3 IMPLANT
NEEDLE HYPO 25X5/8 SAFETYGLIDE (NEEDLE) IMPLANT
NS IRRIG 1000ML POUR BTL (IV SOLUTION) ×3 IMPLANT
PACK C SECTION WH (CUSTOM PROCEDURE TRAY) ×3 IMPLANT
PAD OB MATERNITY 4.3X12.25 (PERSONAL CARE ITEMS) ×3 IMPLANT
PENCIL SMOKE EVAC W/HOLSTER (ELECTROSURGICAL) ×3 IMPLANT
RTRCTR C-SECT PINK 25CM LRG (MISCELLANEOUS) IMPLANT
RTRCTR C-SECT PINK 34CM XLRG (MISCELLANEOUS) IMPLANT
SPONGE LAP 18X18 RF (DISPOSABLE) ×21 IMPLANT
SPONGE STICK PVP PAINT (SPONGE) ×3 IMPLANT
STRIP CLOSURE SKIN 1/2X4 (GAUZE/BANDAGES/DRESSINGS) ×2 IMPLANT
SUT MNCRL 0 VIOLET CTX 36 (SUTURE) ×2 IMPLANT
SUT MONOCRYL 0 CTX 36 (SUTURE) ×4
SUT PLAIN 2 0 (SUTURE) ×2
SUT PLAIN ABS 2-0 CT1 27XMFL (SUTURE) ×1 IMPLANT
SUT VIC AB 0 CT1 27 (SUTURE) ×2
SUT VIC AB 0 CT1 27XBRD ANBCTR (SUTURE) ×1 IMPLANT
SUT VIC AB 2-0 CT1 27 (SUTURE) ×2
SUT VIC AB 2-0 CT1 TAPERPNT 27 (SUTURE) ×1 IMPLANT
SUT VIC AB 4-0 KS 27 (SUTURE) ×3 IMPLANT
SYR BULB IRRIGATION 50ML (SYRINGE) IMPLANT
TOWEL OR 17X24 6PK STRL BLUE (TOWEL DISPOSABLE) ×3 IMPLANT
TRAY FOLEY W/BAG SLVR 14FR LF (SET/KITS/TRAYS/PACK) ×3 IMPLANT
WATER STERILE IRR 1000ML POUR (IV SOLUTION) ×3 IMPLANT

## 2017-12-17 NOTE — Progress Notes (Addendum)
Patient ID: Melanie KendallMonica Heredia-Carrillo, female   DOB: 05/29/1980, 37 y.o.   MRN: [id number] Fever now 102. Patient agrees to start pushng again  Vitals:   12/17/17 2031 12/17/17 2102 12/17/17 2131 12/17/17 2141  BP: (!) 111/97 123/82 111/71   Pulse: 93 (!) 127 82   Resp:    17  Temp:    (!) 102 F (38.9 C)  TempSrc:    Axillary  SpO2:      Weight:      Height:       FHR stable with early decels with contractions UCs more adequate now on increased pitocin, 190 MVU/4110min  Will treat fever with Tylenol Will start Amp/Gent for fever protocol  Resume pushing

## 2017-12-17 NOTE — Progress Notes (Signed)
Pharmacy Antibiotic Note  Melanie Warner is a 37 y.o. female admitted on 12/16/2017 at [redacted]w[redacted]d and is now in labor with maternal temp. Pharmacy has been consulted for Gentamicin dosing.  Plan: Gentamicin 150mg  Iv Q 8 hr Therapeutic Goal: Peak~6-7mg /L Trough < 1mg /L Will follow and check levels if continued.  Height: 5\' 4"  (162.6 cm) Weight: 182 lb 12.2 oz (82.9 kg) IBW/kg (Calculated) : 54.7  Temp (24hrs), Avg:98.7 F (37.1 C), Min:97.4 F (36.3 C), Max:102 F (38.9 C)  Recent Labs  Lab 12/16/17 2256  WBC 10.8*    CrCl cannot be calculated (Patient's most recent lab result is older than the maximum 21 days allowed.).    No Known Allergies  Antimicrobials this admission: 12/17/17 Amp 2g IV Q 6>>    Thank you for allowing pharmacy to be a part of this patient's care.  Hovey-Rankin, Addison Whidbee 12/17/2017 10:18 PM

## 2017-12-17 NOTE — Progress Notes (Signed)
Melanie Warner is a 37 y.o. G2P1001 at [redacted]w[redacted]d by LMP admitted for induction of labor due to postdates and prolonged deceleration in MAU.  Subjective:   Objective: BP (!) 89/54   Pulse 86   Temp 98.6 F (37 C) (Oral)   Resp 16   Ht 5\' 4"  (1.626 m)   Wt 82.9 kg   LMP 03/08/2017 (Exact Date)   SpO2 99%   BMI 31.37 kg/m  No intake/output data recorded. No intake/output data recorded.  FHT:  FHR: 135 bpm, variability: moderate,  accelerations:  Present,  decelerations:  Present occasional lates, resolve with position change, fluids decreased Pitocin. UC:   irregular, every 3-10 minutes, toco readjusted for improved tracing SVE:   Dilation: 3.5 Effacement (%): 50 Station: -3 Exam by:: Sharen Counter, CNM  Vertex, posterior and -3 but head well applied to cervix.  Since Pitocin decreased due to FHR decels, attempt to AROM but unsuccessful. Pt tolerated well.    Labs: Lab Results  Component Value Date   WBC 10.8 (H) 12/16/2017   HGB 10.7 (L) 12/16/2017   HCT 33.1 (L) 12/16/2017   MCV 83.4 12/16/2017   PLT 273 12/16/2017    Assessment / Plan: Induction of labor due to postdates and deceleration,  progressing well on pitocin  Labor: Continue to increase Pitocin, consider AROM with lower station of fetal vertex Preeclampsia:  n/a Fetal Wellbeing:  Category II Pain Control:  Epidural I/D:  GBS neg Anticipated MOD:  NSVD  Kona Yusuf Leftwich-Kirby 12/17/2017, 10:05 AM

## 2017-12-17 NOTE — Anesthesia Preprocedure Evaluation (Signed)
Anesthesia Evaluation  Patient identified by MRN, date of birth, ID band Patient awake    Reviewed: Allergy & Precautions, NPO status , Patient's Chart, lab work & pertinent test results  Airway Mallampati: I  TM Distance: >3 FB Neck ROM: Full    Dental no notable dental hx. (+) Teeth Intact   Pulmonary    Pulmonary exam normal breath sounds clear to auscultation       Cardiovascular Normal cardiovascular exam Rhythm:Regular Rate:Normal     Neuro/Psych Depression negative neurological ROS     GI/Hepatic negative GI ROS, Neg liver ROS,   Endo/Other  negative endocrine ROS  Renal/GU negative Renal ROS  negative genitourinary   Musculoskeletal negative musculoskeletal ROS (+)   Abdominal   Peds  Hematology negative hematology ROS (+)   Anesthesia Other Findings   Reproductive/Obstetrics (+) Pregnancy                             Anesthesia Physical Anesthesia Plan  ASA: II  Anesthesia Plan: Epidural   Post-op Pain Management:    Induction:   PONV Risk Score and Plan: Treatment may vary due to age or medical condition  Airway Management Planned: Natural Airway  Additional Equipment:   Intra-op Plan:   Post-operative Plan:   Informed Consent: I have reviewed the patients History and Physical, chart, labs and discussed the procedure including the risks, benefits and alternatives for the proposed anesthesia with the patient or authorized representative who has indicated his/her understanding and acceptance.     Plan Discussed with:   Anesthesia Plan Comments: (Patient identified. Risks, benefits, options discussed with patient including but not limited to bleeding, infection, nerve damage, paralysis, failed block, incomplete pain control, headache, blood pressure changes, nausea, vomiting, reactions to medication, itching, and post partum back pain. Confirmed with bedside nurse  the patient's most recent platelet count. Confirmed with the patient that they are not taking any anticoagulation, have any bleeding history or any family history of bleeding disorders. Patient expressed understanding and wishes to proceed. All questions were answered. )        Anesthesia Quick Evaluation

## 2017-12-17 NOTE — Progress Notes (Addendum)
Labor Progress Note Melanie Warner is a 37 y.o. G2P1001 at [redacted]w[redacted]d presented for IOL for PD and prolonged decel in the MAU.  S: Pt underwent another trial of active pushing after laboring down for about two hours.  Pt pushed for about an hour with little to no change of station.  Pt is feeling minimal pain w/ contractions.  Pt feeling emotionally exhausted.    O:  BP 113/76   Pulse (!) 118   Temp 99.5 F (37.5 C) (Oral)   Resp 18   Ht 5\' 4"  (1.626 m)   Wt 82.9 kg   LMP 03/08/2017 (Exact Date)   SpO2 96%   BMI 31.37 kg/m  EFM: 150/moderate var/pos accels/early decels with pushing  CVE: Dilation: 10 Dilation Complete Date: 12/17/17 Dilation Complete Time: 1720 Effacement (%): 100 Station: 0 Presentation: Vertex Exam by:: lee   A&P: 37 y.o. G2P1001 [redacted]w[redacted]d here for IOL for PD and prolonged decel #Labor: hypotonic contractions presently, IUPC placed, increasing Pit, will labor down for about one hour and attempt to push #Pain: epidural in place #FWB: category I #GBS negative   Mirian Mo, MD 9:12 PM  I confirm that I have verified the information documented in the resident's note and that I have also personally reperformed the physical exam and all medical decision making activities. IUPC inserted 150-160 MVU/15min  Aviva Signs, CNM

## 2017-12-17 NOTE — Progress Notes (Signed)
Melanie Warner is a 37 y.o. G2P1001 at [redacted]w[redacted]d she has been complete since 6 pm, labored down, has now pushed x 3 hours and remains at -1 tp 0 station with slight caput. No descent with pushing.  Subjective: Pt exhausted and ready for alternatives.  Objective: BP 113/76   Pulse 93   Temp (!) 102 F (38.9 C) (Axillary)   Resp 17   Ht 5\' 4"  (1.626 m)   Wt 82.9 kg   LMP 03/08/2017 (Exact Date)   SpO2 96%   BMI 31.37 kg/m  No intake/output data recorded. Total I/O In: -  Out: 150 [Urine:150] Has received Amp / Gentamycin for III. One dose so far. FHT:  FHR: 140 bpm, variability: minimal ,  accelerations:  Present,  decelerations:  Absent UC:   regular, every 3 minutes until pit was d/c'd SVE:   Dilation: 10 Effacement (%): 100 Station: 0 Exam by:: lee Exam confirmed by me Labs: Lab Results  Component Value Date   WBC 10.8 (H) 12/16/2017   HGB 10.7 (L) 12/16/2017   HCT 33.1 (L) 12/16/2017   MCV 83.4 12/16/2017   PLT 273 12/16/2017    Assessment / Plan: Arrest in active phase of labor  Labor: inadequate pelvic diameters confirmed, bispinous 8.5 clinically Preeclampsia:  intake and ouput balanced Fetal Wellbeing:  Category I Pain Control:  Epidural I/D:  n/a Anticipated MOD:  cesarean section recommended with risks alternatives and potential complications reviewed. Patient in favor of proceeding to cesarean delivery Melanie Warner 12/17/2017, 11:23 PM

## 2017-12-17 NOTE — Progress Notes (Signed)
Labor Progress Note Melanie Warner is a 37 y.o. G2P1001 at [redacted]w[redacted]d presented for  induction of labor due to postdates and prolonged deceleration in MAU. S: Doing well, contractions mild w/ epidural. Resting comfortably.  O:  BP 105/70   Pulse 77   Temp 98.5 F (36.9 C) (Oral)   Resp 16   Ht 5\' 4"  (1.626 m)   Wt 82.9 kg   LMP 03/08/2017 (Exact Date)   SpO2 96%   BMI 31.37 kg/m  EFM: 135/mod var/+accels, no decels  CVE: Dilation: 5.5 Effacement (%): 80 Station: -2 Presentation: Vertex Exam by:: lee   A&P: 37 y.o. G2P1001 [redacted]w[redacted]d  induction of labor due to postdates and prolonged deceleration in MAU. #Labor: Progressing well. SROM 1130. Cont Pitocin, uptitrate when appropriate #Pain: epidural #FWB: Cat 1 FHT #GBS negative  Denzil Hughes, MD 3:35 PM

## 2017-12-17 NOTE — Progress Notes (Signed)
Patient ID: Melanie Warner, female   DOB: 11/06/1980, 37 y.o.   MRN: [id number] Melanie Warner is a 37 y.o. G2P1001 at [redacted]w[redacted]d admitted for IOL d/t postdates/decel in MAU  Subjective: UCs getting a little more uncomfortable, still able to sleep through them  Objective: BP 113/72   Pulse 81   Temp 97.9 F (36.6 C) (Oral)   Resp 18   Ht 5\' 4"  (1.626 m)   Wt 82.9 kg   LMP 03/08/2017 (Exact Date)   SpO2 99%   BMI 31.37 kg/m  No intake/output data recorded.  FHT:  FHR: 135 bpm, variability: moderate,  accelerations:  Present,  decelerations:  Absent UC:   q 3-32mins  SVE:   Dilation: 3.5 Effacement (%): 50 Station: -3 Exam by:: [email or web address], cnm  Pitocin @ 8 mu/min  Labs: Lab Results  Component Value Date   WBC 10.8 (H) 12/16/2017   HGB 10.7 (L) 12/16/2017   HCT 33.1 (L) 12/16/2017   MCV 83.4 12/16/2017   PLT 273 12/16/2017    Assessment / Plan: IOL d/t postdates/decel in MAU, on pitocin, will continue to increase to achieve adequate labor  Labor: Progressing normally Fetal Wellbeing:  Category I Pain Control:  Labor support without medications Pre-eclampsia: n/a I/D:  n/a Anticipated MOD:  NSVD  Cheral Marker CNM, WHNP-BC 12/17/2017, 5:52 AM

## 2017-12-17 NOTE — Anesthesia Procedure Notes (Signed)
Epidural Patient location during procedure: OB Start time: 12/17/2017 12:20 PM End time: 12/17/2017 12:30 PM  Staffing Anesthesiologist: Elmer PickerWoodrum, Rankin Coolman L, MD Performed: anesthesiologist   Preanesthetic Checklist Completed: patient identified, pre-op evaluation, timeout performed, IV checked, risks and benefits discussed and monitors and equipment checked  Epidural Patient position: sitting Prep: site prepped and draped and DuraPrep Patient monitoring: continuous pulse ox, blood pressure, heart rate and cardiac monitor Approach: midline Location: L3-L4 Injection technique: LOR air  Needle:  Needle type: Tuohy  Needle gauge: 17 G Needle length: 9 cm Needle insertion depth: 4.5 cm Catheter type: closed end flexible Catheter size: 19 Gauge Catheter at skin depth: 10 cm Test dose: negative  Assessment Sensory level: T8 Events: blood not aspirated, injection not painful, no injection resistance, negative IV test and no paresthesia  Additional Notes Patient identified. Risks/Benefits/Options discussed with patient including but not limited to bleeding, infection, nerve damage, paralysis, failed block, incomplete pain control, headache, blood pressure changes, nausea, vomiting, reactions to medication both or allergic, itching and postpartum back pain. Confirmed with bedside nurse the patient's most recent platelet count. Confirmed with patient that they are not currently taking any anticoagulation, have any bleeding history or any family history of bleeding disorders. Patient expressed understanding and wished to proceed. All questions were answered. Sterile technique was used throughout the entire procedure. Please see nursing notes for vital signs. Test dose was given through epidural catheter and negative prior to continuing to dose epidural or start infusion. Warning signs of high block given to the patient including shortness of breath, tingling/numbness in hands, complete motor  block, or any concerning symptoms with instructions to call for help. Patient was given instructions on fall risk and not to get out of bed. All questions and concerns addressed with instructions to call with any issues or inadequate analgesia.  Reason for block:procedure for pain

## 2017-12-17 NOTE — Plan of Care (Signed)
POC reviewed. All patient and family questions answered.  Laboring down and pushing intermittently after discussing with CNM and team.  Frequent repositioning to facilitate fetal rotation.  Pt tearful, encouragement and reassurance as well as environmental changes such as limiting providers to one RN and CNM and attending MD if necessary.  Currently using tug of war to help pt with pushing, will try pushing on sides as well.  Communicating with team as needed and continuing to monitor.

## 2017-12-18 ENCOUNTER — Encounter (HOSPITAL_COMMUNITY): Payer: Self-pay

## 2017-12-18 DIAGNOSIS — Z98891 History of uterine scar from previous surgery: Secondary | ICD-10-CM

## 2017-12-18 DIAGNOSIS — O339 Maternal care for disproportion, unspecified: Secondary | ICD-10-CM

## 2017-12-18 DIAGNOSIS — O480 Post-term pregnancy: Secondary | ICD-10-CM

## 2017-12-18 DIAGNOSIS — Z3A40 40 weeks gestation of pregnancy: Secondary | ICD-10-CM

## 2017-12-18 LAB — CBC WITH DIFFERENTIAL/PLATELET
BASOS PCT: 0 %
Basophils Absolute: 0 10*3/uL (ref 0.0–0.1)
Eosinophils Absolute: 0 10*3/uL (ref 0.0–0.7)
Eosinophils Relative: 0 %
HEMATOCRIT: 26 % — AB (ref 36.0–46.0)
Hemoglobin: 8.6 g/dL — ABNORMAL LOW (ref 12.0–15.0)
Lymphocytes Relative: 14 %
Lymphs Abs: 2.4 10*3/uL (ref 0.7–4.0)
MCH: 27.5 pg (ref 26.0–34.0)
MCHC: 33.1 g/dL (ref 30.0–36.0)
MCV: 83.1 fL (ref 78.0–100.0)
MONOS PCT: 5 %
Monocytes Absolute: 0.9 10*3/uL (ref 0.1–1.0)
Neutro Abs: 13.2 10*3/uL — ABNORMAL HIGH (ref 1.7–7.7)
Neutrophils Relative %: 81 %
Platelets: 218 10*3/uL (ref 150–400)
RBC: 3.13 MIL/uL — ABNORMAL LOW (ref 3.87–5.11)
RDW: 15.1 % (ref 11.5–15.5)
WBC: 16.4 10*3/uL — ABNORMAL HIGH (ref 4.0–10.5)

## 2017-12-18 LAB — CBC
HEMATOCRIT: 29.5 % — AB (ref 36.0–46.0)
Hemoglobin: 9.6 g/dL — ABNORMAL LOW (ref 12.0–15.0)
MCH: 27 pg (ref 26.0–34.0)
MCHC: 32.5 g/dL (ref 30.0–36.0)
MCV: 82.9 fL (ref 78.0–100.0)
Platelets: 240 10*3/uL (ref 150–400)
RBC: 3.56 MIL/uL — ABNORMAL LOW (ref 3.87–5.11)
RDW: 14.9 % (ref 11.5–15.5)
WBC: 19.8 10*3/uL — ABNORMAL HIGH (ref 4.0–10.5)

## 2017-12-18 MED ORDER — FENTANYL CITRATE (PF) 100 MCG/2ML IJ SOLN
INTRAMUSCULAR | Status: DC | PRN
  Administered 2017-12-18: 100 ug via EPIDURAL

## 2017-12-18 MED ORDER — WITCH HAZEL-GLYCERIN EX PADS: 1.0000 "application " | MEDICATED_PAD | CUTANEOUS | Status: DC | PRN

## 2017-12-18 MED ORDER — OXYCODONE HCL 5 MG PO TABS: 5.0000 mg | ORAL_TABLET | Freq: Once | ORAL | Status: DC | PRN

## 2017-12-18 MED ORDER — DIPHENHYDRAMINE HCL 25 MG PO CAPS: 25.0000 mg | ORAL_CAPSULE | ORAL | Status: DC | PRN

## 2017-12-18 MED ORDER — KETOROLAC TROMETHAMINE 30 MG/ML IJ SOLN
INTRAMUSCULAR | Status: AC
  Filled 2017-12-18: qty 1

## 2017-12-18 MED ORDER — IBUPROFEN 600 MG PO TABS
600.0000 mg | ORAL_TABLET | Freq: Four times a day (QID) | ORAL | Status: DC
  Administered 2017-12-18 – 2017-12-21 (×12): 600 mg via ORAL
  Filled 2017-12-18 (×13): qty 1

## 2017-12-18 MED ORDER — MORPHINE SULFATE (PF) 0.5 MG/ML IJ SOLN
INTRAMUSCULAR | Status: DC | PRN
  Administered 2017-12-18: 3 mg via EPIDURAL

## 2017-12-18 MED ORDER — DIPHENHYDRAMINE HCL 50 MG/ML IJ SOLN: 12.5000 mg | INTRAMUSCULAR | Status: DC | PRN

## 2017-12-18 MED ORDER — ONDANSETRON HCL 4 MG/2ML IJ SOLN: 4.0000 mg | Freq: Three times a day (TID) | INTRAMUSCULAR | Status: DC | PRN

## 2017-12-18 MED ORDER — SODIUM CHLORIDE 0.9 % IV SOLN
INTRAVENOUS | Status: DC | PRN
  Administered 2017-12-18: 22:00:00 via INTRAVENOUS

## 2017-12-18 MED ORDER — DIBUCAINE 1 % RE OINT: 1.0000 "application " | TOPICAL_OINTMENT | RECTAL | Status: DC | PRN

## 2017-12-18 MED ORDER — SIMETHICONE 80 MG PO CHEW
80.0000 mg | CHEWABLE_TABLET | Freq: Three times a day (TID) | ORAL | Status: DC
  Administered 2017-12-18 – 2017-12-20 (×7): 80 mg via ORAL
  Filled 2017-12-18 (×9): qty 1

## 2017-12-18 MED ORDER — PHENYLEPHRINE HCL 10 MG/ML IJ SOLN
INTRAMUSCULAR | Status: DC | PRN
  Administered 2017-12-18: 80 ug via INTRAVENOUS

## 2017-12-18 MED ORDER — NALBUPHINE HCL 10 MG/ML IJ SOLN: 5.0000 mg | Freq: Once | INTRAMUSCULAR | Status: DC | PRN

## 2017-12-18 MED ORDER — SCOPOLAMINE 1 MG/3DAYS TD PT72
1.0000 | MEDICATED_PATCH | Freq: Once | TRANSDERMAL | Status: DC
  Filled 2017-12-18: qty 1

## 2017-12-18 MED ORDER — GENTAMICIN SULFATE 40 MG/ML IJ SOLN
150.0000 mg | Freq: Three times a day (TID) | INTRAVENOUS | Status: AC
  Administered 2017-12-18 – 2017-12-19 (×3): 150 mg via INTRAVENOUS
  Filled 2017-12-18 (×3): qty 3.75

## 2017-12-18 MED ORDER — NALOXONE HCL 0.4 MG/ML IJ SOLN: 0.4000 mg | INTRAMUSCULAR | Status: DC | PRN

## 2017-12-18 MED ORDER — OXYCODONE-ACETAMINOPHEN 5-325 MG PO TABS
1.0000 | ORAL_TABLET | ORAL | Status: DC | PRN
  Administered 2017-12-19 – 2017-12-21 (×5): 1 via ORAL
  Filled 2017-12-18 (×5): qty 1

## 2017-12-18 MED ORDER — SENNOSIDES-DOCUSATE SODIUM 8.6-50 MG PO TABS
2.0000 | ORAL_TABLET | ORAL | Status: DC
  Administered 2017-12-18 – 2017-12-19 (×2): 2 via ORAL
  Filled 2017-12-18 (×3): qty 2

## 2017-12-18 MED ORDER — MEPERIDINE HCL 25 MG/ML IJ SOLN: 6.2500 mg | INTRAMUSCULAR | Status: DC | PRN

## 2017-12-18 MED ORDER — NALOXONE HCL 4 MG/10ML IJ SOLN
1.0000 ug/kg/h | INTRAVENOUS | Status: DC | PRN
  Filled 2017-12-18: qty 5

## 2017-12-18 MED ORDER — TETANUS-DIPHTH-ACELL PERTUSSIS 5-2.5-18.5 LF-MCG/0.5 IM SUSP: 0.5000 mL | Freq: Once | INTRAMUSCULAR | Status: DC

## 2017-12-18 MED ORDER — SODIUM CHLORIDE 0.9 % IV SOLN
2.0000 g | Freq: Four times a day (QID) | INTRAVENOUS | Status: AC
  Administered 2017-12-18 – 2017-12-19 (×4): 2 g via INTRAVENOUS
  Filled 2017-12-18 (×4): qty 2

## 2017-12-18 MED ORDER — DEXAMETHASONE SODIUM PHOSPHATE 4 MG/ML IJ SOLN
INTRAMUSCULAR | Status: DC | PRN
  Administered 2017-12-18: 4 mg via INTRAVENOUS

## 2017-12-18 MED ORDER — SODIUM CHLORIDE 0.9% FLUSH: 3.0000 mL | INTRAVENOUS | Status: DC | PRN

## 2017-12-18 MED ORDER — OXYCODONE HCL 5 MG/5ML PO SOLN: 5.0000 mg | Freq: Once | ORAL | Status: DC | PRN

## 2017-12-18 MED ORDER — FENTANYL CITRATE (PF) 250 MCG/5ML IJ SOLN
INTRAMUSCULAR | Status: AC
  Filled 2017-12-18: qty 5

## 2017-12-18 MED ORDER — PRENATAL MULTIVITAMIN CH
1.0000 | ORAL_TABLET | Freq: Every day | ORAL | Status: DC
  Administered 2017-12-19 – 2017-12-21 (×3): 1 via ORAL
  Filled 2017-12-18 (×3): qty 1

## 2017-12-18 MED ORDER — SODIUM BICARBONATE 8.4 % IV SOLN
INTRAVENOUS | Status: DC | PRN
  Administered 2017-12-17 – 2017-12-18 (×3): 5 mL via EPIDURAL

## 2017-12-18 MED ORDER — NALBUPHINE HCL 10 MG/ML IJ SOLN: 5.0000 mg | INTRAMUSCULAR | Status: DC | PRN

## 2017-12-18 MED ORDER — ACETAMINOPHEN 325 MG PO TABS
650.0000 mg | ORAL_TABLET | ORAL | Status: DC | PRN
  Administered 2017-12-19 – 2017-12-20 (×2): 650 mg via ORAL
  Filled 2017-12-18 (×2): qty 2

## 2017-12-18 MED ORDER — MEPERIDINE HCL 25 MG/ML IJ SOLN
INTRAMUSCULAR | Status: AC
  Filled 2017-12-18: qty 1

## 2017-12-18 MED ORDER — SIMETHICONE 80 MG PO CHEW: 80.0000 mg | CHEWABLE_TABLET | ORAL | Status: DC | PRN

## 2017-12-18 MED ORDER — LACTATED RINGERS IV SOLN
INTRAVENOUS | Status: DC
  Administered 2017-12-18 (×2): via INTRAVENOUS

## 2017-12-18 MED ORDER — OXYTOCIN 40 UNITS IN LACTATED RINGERS INFUSION - SIMPLE MED: 2.5000 [IU]/h | INTRAVENOUS | Status: AC

## 2017-12-18 MED ORDER — COCONUT OIL OIL
1.0000 "application " | TOPICAL_OIL | Status: DC | PRN
  Administered 2017-12-20: 1 via TOPICAL
  Filled 2017-12-18: qty 120

## 2017-12-18 MED ORDER — HYDROMORPHONE HCL 1 MG/ML IJ SOLN: 0.2500 mg | INTRAMUSCULAR | Status: DC | PRN

## 2017-12-18 MED ORDER — SCOPOLAMINE 1 MG/3DAYS TD PT72
MEDICATED_PATCH | TRANSDERMAL | Status: DC | PRN
  Administered 2017-12-18: 1 via TRANSDERMAL

## 2017-12-18 MED ORDER — PROMETHAZINE HCL 25 MG/ML IJ SOLN: 6.2500 mg | INTRAMUSCULAR | Status: DC | PRN

## 2017-12-18 MED ORDER — ENOXAPARIN SODIUM 40 MG/0.4ML ~~LOC~~ SOLN
40.0000 mg | SUBCUTANEOUS | Status: DC
  Administered 2017-12-19: 40 mg via SUBCUTANEOUS
  Filled 2017-12-18: qty 0.4

## 2017-12-18 MED ORDER — OXYTOCIN 10 UNIT/ML IJ SOLN
INTRAVENOUS | Status: DC | PRN
  Administered 2017-12-18: 40 [IU] via INTRAVENOUS

## 2017-12-18 MED ORDER — KETOROLAC TROMETHAMINE 30 MG/ML IJ SOLN
30.0000 mg | Freq: Once | INTRAMUSCULAR | Status: AC | PRN
  Administered 2017-12-18: 30 mg via INTRAVENOUS

## 2017-12-18 MED ORDER — SIMETHICONE 80 MG PO CHEW
80.0000 mg | CHEWABLE_TABLET | ORAL | Status: DC
  Administered 2017-12-18 – 2017-12-21 (×3): 80 mg via ORAL
  Filled 2017-12-18 (×3): qty 1

## 2017-12-18 MED ORDER — DIPHENHYDRAMINE HCL 25 MG PO CAPS: 25.0000 mg | ORAL_CAPSULE | Freq: Four times a day (QID) | ORAL | Status: DC | PRN

## 2017-12-18 MED ORDER — ZOLPIDEM TARTRATE 5 MG PO TABS: 5.0000 mg | ORAL_TABLET | Freq: Every evening | ORAL | Status: DC | PRN

## 2017-12-18 MED ORDER — MEPERIDINE HCL 25 MG/ML IJ SOLN
INTRAMUSCULAR | Status: DC | PRN
  Administered 2017-12-18: 6.25 mg via INTRAVENOUS

## 2017-12-18 MED ORDER — SODIUM CHLORIDE 0.9 % IR SOLN
Status: DC | PRN
  Administered 2017-12-18: 1

## 2017-12-18 MED ORDER — OXYCODONE-ACETAMINOPHEN 5-325 MG PO TABS: 2.0000 | ORAL_TABLET | ORAL | Status: DC | PRN

## 2017-12-18 MED ORDER — MENTHOL 3 MG MT LOZG: 1.0000 | LOZENGE | OROMUCOSAL | Status: DC | PRN

## 2017-12-18 NOTE — Progress Notes (Signed)
Subjective: Postpartum Day0: Cesarean Delivery, for Cephalopelvic disproportion / Chorioamnionitis .  Patient received 2 doses Amp Gent prior to surgery, but did not have order continued x 24 hours postop. I am ordering that now.  Objective: Vital signs in last 24 hours: Temp:  [97.7 F (36.5 C)-102 F (38.9 C)] 98.6 F (37 C) (09/26 1030) Pulse Rate:  [69-127] 82 (09/26 0626) Resp:  [16-31] 20 (09/26 1030) BP: (96-130)/(57-97) 106/66 (09/26 0626) SpO2:  [95 %-99 %] 96 % (09/26 1246)  Physical Exam:   Labs.: CBC Latest Ref Rng & Units 12/18/2017 12/18/2017 12/16/2017  WBC 4.0 - 10.5 K/uL 16.4(H) 19.8(H) 10.8(H)  Hemoglobin 12.0 - 15.0 g/dL 1.6(X) 0.9(U) 10.7(L)  Hematocrit 36.0 - 46.0 % 26.0(L) 29.5(L) 33.1(L)  Platelets 150 - 400 K/uL 218 240 273     Assessment/Plan: Status post Cesarean section. Postoperative course complicated by Chorioamnionitis, warranting 24 hours postop antibiotics.  Ampicillin 2 gm q 6 and Gentamicin 150 mg q 8 h continued x 24 hours CBC BMET in a.m.  [name] 12/18/2017, 12:56 PM

## 2017-12-18 NOTE — Brief Op Note (Signed)
12/18/2017  1:32 AM  PATIENT:  Melanie Warner  37 y.o. female  PRE-OPERATIVE DIAGNOSIS:  Primary cesarean section; cephalopelvic disproportion  POST-OPERATIVE DIAGNOSIS:  Primary cesarean section; cephalopelvic disproportion  PROCEDURE:  Procedure(s): CESAREAN SECTION (N/A)  SURGEON:  Surgeon(s) and Role:    * [name], MD - Primary  PHYSICIAN ASSISTANT:   ASSISTANTS: none   ANESTHESIA:   epidural  EBL:  696 mL   BLOOD ADMINISTERED:none  DRAINS: Urinary Catheter (Foley)   LOCAL MEDICATIONS USED:  NONE  SPECIMEN:  Source of Specimen:  Placenta to pathology  DISPOSITION OF SPECIMEN:  PATHOLOGY  COUNTS:  YES  TOURNIQUET:  * No tourniquets in log *  DICTATION: .Dragon Dictation  PLAN OF CARE: Admit to inpatient   PATIENT DISPOSITION:  PACU - hemodynamically stable.   Delay start of Pharmacological VTE agent (>24hrs) due to surgical blood loss or risk of bleeding: not applicable Details of procedure: Patient was taken to the operating room prepped and draped for lower abdominal surgery timeout conducted, azithromycin and Ancef administered.  Patient previously received antibiotics about 4 hours earlier for intra-amniotic inflammation and infection, triple I, for the diagnosis of cephalopelvic disproportion with arrest in active labor was diagnosed.  Transverse lower abdominal incision was performed with standard method of Pfannenstiel with sharp dissection down to the fascia which was opened transversely the rectus muscle split in the midline the peritoneum opened, Alexis retractor position and bladder flap developed from the lower uterine segment and bladder move downward.  Transverse incision was made at level just above the fetal shoulder, and the area and milky amniotic fluid without malodor encountered.  Fetal vertex was rotated into the incision and delivered with fundal pressure.  The cord was clamped at 1 minute and placenta delivered without  difficulty.  Uterus tone was good.  The lower uterine segment was quite floppy and the incision was about the level of the cervix uterus was closed in 2 layer fashion running locking 0 Monocryl first layer and continuous running 0 Monocryl second layer after delivery of the placenta and irrigation of the uterus with swabbing out of the uterine cavity. After closure of the uterus, the abdomen was irrigated, anterior peritoneum irrigated, the anterior peritoneum closed with continuous running 2-0 Vicryl the fascia closed with 0 Vicryl the subcu fatty tissues irrigated confirmed as hemostatic reapproximated with subcu horizontal 0 plain sutures and suture closing of the skin sponge and needle counts were correct patient will be given 24 hours of Ancef and gentamicin

## 2017-12-18 NOTE — Anesthesia Postprocedure Evaluation (Signed)
Anesthesia Post Note  Patient: Melanie Warner  Procedure(s) Performed: CESAREAN SECTION (N/A )     Patient location during evaluation: Mother Baby Anesthesia Type: Epidural Level of consciousness: awake and alert Pain management: pain level controlled Vital Signs Assessment: post-procedure vital signs reviewed and stable Respiratory status: spontaneous breathing, nonlabored ventilation and respiratory function stable Cardiovascular status: stable Postop Assessment: no headache, no backache and epidural receding Anesthetic complications: no    Last Vitals:  Vitals:   12/18/17 0410 12/18/17 0527  BP: 109/66 105/62  Pulse: 86 80  Resp: 18 18  Temp: 37.1 C 36.5 C  SpO2: 96% 98%    Last Pain:  Vitals:   12/18/17 0527  TempSrc:   PainSc: 1    Pain Goal:                 Lowella Curb

## 2017-12-18 NOTE — Anesthesia Postprocedure Evaluation (Signed)
Anesthesia Post Note  Patient: Melanie Warner  Procedure(s) Performed: CESAREAN SECTION (N/A )     Patient location during evaluation: Mother Baby Anesthesia Type: Epidural Level of consciousness: awake, awake and alert and oriented Pain management: pain level controlled Vital Signs Assessment: post-procedure vital signs reviewed and stable Respiratory status: spontaneous breathing and respiratory function stable Cardiovascular status: blood pressure returned to baseline and stable Postop Assessment: no headache, epidural receding, patient able to bend at knees, adequate PO intake, no backache, no apparent nausea or vomiting and able to ambulate Anesthetic complications: no    Last Vitals:  Vitals:   12/18/17 0626 12/18/17 0827  BP: 106/66   Pulse: 82   Resp: 20   Temp: 37.3 C   SpO2: 97% 95%    Last Pain:  Vitals:   12/18/17 0626  TempSrc: Oral  PainSc: 0-No pain   Pain Goal:                 Cleda Clarks

## 2017-12-18 NOTE — Lactation Note (Signed)
This note was copied from a baby's chart. Lactation Consultation Note  Patient Name: Melanie Warner [id number] Date: 12/18/2017 Reason for consult: Follow-up assessment;Term  P2 mother whose infant is now 61 hours old.  Mother breast fed her first child for a few days.  Mother stated that baby last fed at 1700 but has been sleepy.  I offered to assist with latch and mother accepted.  Mother has small, firm breasts with little breast tissue.  Nipples are everted bilaterally.  Assisted baby to latch in the cross cradle hold on the left breast without success.  After a few attempts he opened his mouth and latched but would not suck at all.  Gentle stimulation only caused him to become upset.  I suggested mother do STS and watch for feeding cues.  Reviewed feeding cues with her.  As soon as he was on her chest he fell asleep.  Encouraged to continue feeding 8-12 times/24 hours or sooner if he shows feeding cues.  Attempted hand expression and no colostrum noted at this time.  Mother will continue to use the DEBP that has been set up at bedisde and feed back any EBM she obtains to baby.  Mother verbalized understanding.    Father at bedside and supportive.  RN in room and aware of feeding attempt.     Maternal Data Formula Feeding for Exclusion: No Has patient been taught Hand Expression?: Yes Does the patient have breastfeeding experience prior to this delivery?: Yes  Feeding Feeding Type: Breast Fed Length of feed: 0 min  LATCH Score Latch: Too sleepy or reluctant, no latch achieved, no sucking elicited.  Audible Swallowing: None  Type of Nipple: Everted at rest and after stimulation  Comfort (Breast/Nipple): Soft / non-tender  Hold (Positioning): Assistance needed to correctly position infant at breast and maintain latch.  LATCH Score: 5  Interventions Interventions: Breast feeding basics reviewed;Assisted with latch;Skin to skin;Breast massage;Hand express;Position  options;Support pillows;Adjust position;Breast compression;DEBP  Lactation Tools Discussed/Used WIC Program: No   Consult Status Consult Status: Follow-up Date: 12/12/17 Follow-up type: In-patient    Jaylyn Iyer R Verle Brillhart 12/18/2017, 10:24 PM

## 2017-12-18 NOTE — Transfer of Care (Signed)
Immediate Anesthesia Transfer of Care Note  Patient: Melanie Warner  Procedure(s) Performed: CESAREAN SECTION (N/A )  Patient Location: PACU  Anesthesia Type:Epidural  Level of Consciousness: awake, alert  and oriented  Airway & Oxygen Therapy: Patient Spontanous Breathing  Post-op Assessment: Report given to RN and Post -op Vital signs reviewed and stable  Post vital signs: Reviewed,stable. HR 109, RR 20, SaO2 97%, Bp 103/62  Last Vitals:  Vitals Value Taken Time  BP 103/62 12/18/2017 12:54 AM  Temp    Pulse 111 12/18/2017 12:57 AM  Resp 32 12/18/2017 12:57 AM  SpO2 97 % 12/18/2017 12:57 AM  Vitals shown include unvalidated device data.  Last Pain:  Vitals:   12/17/17 2331  TempSrc: Axillary  PainSc:          Complications: No apparent anesthesia complications

## 2017-12-18 NOTE — Lactation Note (Signed)
This note was copied from a baby's chart. Lactation Consultation Note Baby 4 hrs old. Mom concerned baby isn't feeding. Baby rooting but wouldn't latch. Mom has very short shaft nipples. Not compressible. Mom stated her nipples are usually that short. Hand pump to evert.  Baby BF on breast, unable to obtain deep latch. Gave mom shells to wear this am. Hand expressed, not able to see any colostrum at this time.  Fitted mom w/#24 NS. Baby latched well. BF in football position. Discussed feeding positions, support, and comfort. RN to set up DEBP d/t hx: low milk supply w/her first child. Mom knows to pump q3h for 15-20 min. Or at least 5-6 times a day. Newborn behavior, STS, I&O, cluster feeding, breast massage, supply and demand discussed. Mom encouraged to feed baby 8-12 times/24 hours and with feeding cues.  Encouraged to call for assistance or questions. [facility]/LC brochure given w/resources, support groups and LC services.  Patient Name: Melanie Warner [id number] Date: 12/18/2017 Reason for consult: Initial assessment   Maternal Data Formula Feeding for Exclusion: No Has patient been taught Hand Expression?: Yes Does the patient have breastfeeding experience prior to this delivery?: Yes  Feeding Feeding Type: Breast Fed Length of feed: 10 min(still feeding)  LATCH Score Latch: Repeated attempts needed to sustain latch, nipple held in mouth throughout feeding, stimulation needed to elicit sucking reflex.  Audible Swallowing: None  Type of Nipple: Everted at rest and after stimulation(very short shaft)  Comfort (Breast/Nipple): Soft / non-tender  Hold (Positioning): Assistance needed to correctly position infant at breast and maintain latch.  LATCH Score: 6  Interventions Interventions: Breast feeding basics reviewed;Adjust position;DEBP;Assisted with latch;Support pillows;Skin to skin;Position options;Breast massage;Hand express;Pre-pump if needed;Shells;Reverse  pressure;Breast compression;Hand pump  Lactation Tools Discussed/Used Tools: Shells;Pump;Nipple Shields Nipple shield size: 24 Shell Type: Inverted Breast pump type: Double-Electric Breast Pump;Manual WIC Program: No Pump Review: Setup, frequency, and cleaning;Milk Storage Initiated by:: RN Date initiated:: 12/18/17   Consult Status Consult Status: Follow-up Date: 12/18/17(in pm) Follow-up type: In-patient    Charyl Dancer 12/18/2017, 4:52 AM

## 2017-12-18 NOTE — Addendum Note (Signed)
Addendum  created 12/18/17 0926 by Cleda Clarks, CRNA   Sign clinical note

## 2017-12-18 NOTE — Op Note (Signed)
Please see the brief operative note for surgical details 

## 2017-12-19 LAB — CBC WITH DIFFERENTIAL/PLATELET
BASOS ABS: 0 10*3/uL (ref 0.0–0.1)
BASOS PCT: 0 %
Eosinophils Absolute: 0.3 10*3/uL (ref 0.0–0.7)
Eosinophils Relative: 2 %
HCT: 28.4 % — ABNORMAL LOW (ref 36.0–46.0)
Hemoglobin: 9.1 g/dL — ABNORMAL LOW (ref 12.0–15.0)
LYMPHS PCT: 20 %
Lymphs Abs: 3 10*3/uL (ref 0.7–4.0)
MCH: 26.7 pg (ref 26.0–34.0)
MCHC: 32 g/dL (ref 30.0–36.0)
MCV: 83.3 fL (ref 78.0–100.0)
MONO ABS: 0.6 10*3/uL (ref 0.1–1.0)
Monocytes Relative: 4 %
Neutro Abs: 11.4 10*3/uL — ABNORMAL HIGH (ref 1.7–7.7)
Neutrophils Relative %: 74 %
Platelets: 263 10*3/uL (ref 150–400)
RBC: 3.41 MIL/uL — AB (ref 3.87–5.11)
RDW: 15.1 % (ref 11.5–15.5)
WBC: 15.3 10*3/uL — AB (ref 4.0–10.5)

## 2017-12-19 LAB — BASIC METABOLIC PANEL
ANION GAP: 7 (ref 5–15)
BUN: 8 mg/dL (ref 6–20)
CALCIUM: 8.5 mg/dL — AB (ref 8.9–10.3)
CO2: 23 mmol/L (ref 22–32)
Chloride: 106 mmol/L (ref 98–111)
Creatinine, Ser: 0.51 mg/dL (ref 0.44–1.00)
GLUCOSE: 100 mg/dL — AB (ref 70–99)
POTASSIUM: 3.9 mmol/L (ref 3.5–5.1)
Sodium: 136 mmol/L (ref 135–145)

## 2017-12-19 NOTE — Progress Notes (Signed)
CSW received consult for hx of Anxiety/Depression and Edinburgh Score of 15.  CSW met with MOB to offer support and complete assessment.    When CSW arrived, MOB was resting bed and FOB was observing infant while he was laying in the bassinet. CSW explained CSW role and MOB gave CSW permission to complete the assessment while FOB was present.  FOB was engaged during the assessment and was attentive to infant's cues. MOB was polite and receptive to meeting with CSW.   CSW inquired about MOB's hx and MOB acknowledged experiencing anxiety/depression symptoms in 2012 and receiving outpatient counseling. Per MOB, MOB's symptoms subsided after a few months with. MOB reports not having any symptoms since 2012.   CSW provided education regarding the baby blues period vs. perinatal mood disorders, discussed treatment and gave resources for mental health follow up if concerns arise.  CSW recommends self-evaluation during the postpartum time period using the New Mom Checklist from Postpartum Progress and encouraged MOB to contact a medical professional if symptoms are noted at any time.  MOB did not present with any acute symptoms and appeared to having insight and awareness.  MOB also reported having a good support team and feeling prepared to parent. CSW offered outpatient counseling and MOB declined.     CSW identifies no further need for intervention and no barriers to discharge at this time.  Laurey Arrow, MSW, LCSW Clinical Social Work [phone]

## 2017-12-19 NOTE — Progress Notes (Addendum)
Subjective: Postpartum Day 2: Cesarean Delivery Patient reports she is doing "okay" today. She reports that her vaginal bleeding is better. She reports (+) flatus, she denies having a bowel movement. Denies RUQ pain, vision change, and headache. Patient reports that she has been out of bed and has some mild pain around her incision site. She reports eating without nausea. She is currently breastfeeding and requests progesterone only pill for birth control.   Objective: Vital signs in last 24 hours: Temp:  [97.4 F (36.3 C)-98.6 F (37 C)] 98 F (36.7 C) (09/27 0540) Pulse Rate:  [72-85] 72 (09/27 0540) Resp:  [16-20] 18 (09/27 0540) BP: (88-130)/(53-73) 130/73 (09/27 0540) SpO2:  [95 %-100 %] 100 % (09/27 0540)  Physical Exam:  General: alert and cooperative  Heart: Regular rate and rhythm, no murmurs noted.  Lungs: Clear to auscultation bilaterally.  Lochia: appropriate Uterine Fundus: firm DVT Evaluation: Negative Homan's sign. No tenderness to palpation of the lower extremities.   Recent Labs    12/18/17 1157 12/19/17 0527  HGB 8.6* 9.1*  HCT 26.0* 28.4*    Assessment/Plan: Status post Cesarean section. Doing well postoperatively. Patient is tolerating ambulation and PO intake well.  Plan is to continue current care. Monitor patient after meal to determine if she passes flatus or has a bowel movement. Continue to monitor for increased bleeding or pain.  Charyl Dancer 12/19/2017, 7:21 AM   I confirm that I have verified the information documented in the medical student's note and that I have also personally reperformed the physical exam and all medical decision making activities.   Raelyn Mora, CNM 12/19/2017 12:58 PM

## 2017-12-19 NOTE — Progress Notes (Signed)
Foley cath removed at 1600. Pt unable to void within 6 hours. Bladder scan was 464. Straight cath done with 1200ml output. Post cath bladder scan was 119. Will continue to monitor.   Jose Alleyne, RN  

## 2017-12-20 ENCOUNTER — Inpatient Hospital Stay (HOSPITAL_COMMUNITY): Admission: RE | Admit: 2017-12-20 | Payer: Self-pay | Source: Ambulatory Visit

## 2017-12-20 MED ORDER — NORETHINDRONE 0.35 MG PO TABS: 1.0000 | ORAL_TABLET | Freq: Every day | ORAL | 11 refills | Status: AC

## 2017-12-20 MED ORDER — OXYCODONE-ACETAMINOPHEN 5-325 MG PO TABS: 1.0000 | ORAL_TABLET | Freq: Four times a day (QID) | ORAL | 0 refills | Status: AC | PRN

## 2017-12-20 MED ORDER — IBUPROFEN 600 MG PO TABS: 600.0000 mg | ORAL_TABLET | Freq: Three times a day (TID) | ORAL | 0 refills | Status: AC

## 2017-12-20 NOTE — Discharge Summary (Signed)
Obstetrics Discharge Summary OB/GYN Faculty Practice   Patient Name: Melanie Warner DOB: [birth year] MRN: [id number]  Date of admission: 12/16/2017 Delivering MD: [name]   Date of discharge: 12/20/2017  Admitting diagnosis: LEAKING FLUIDS Intrauterine pregnancy: [redacted]w[redacted]d     Secondary diagnosis:   Active Problems:   AMA (advanced maternal age) multigravida 35+   Bilateral nephrolithiasis   Pyelonephritis affecting pregnancy in second trimester   Indication for care in labor and delivery, antepartum   Post-dates pregnancy   Status post primary low transverse cesarean section     Discharge diagnosis: Cesarean delivery for prolonged second stage and failure to descend                         Postpartum procedures: None  Complications: none   Hospital course: Melanie Warner is a 37 y.o. [redacted]w[redacted]d who was admitted for induction for post-dates non non-reactive NST. Her pregnancy was complicated by AMA, kidney stones and pyelonephritis in pregnancy. Her labor course was notable for pitocin, AROM, prolonged second stage, chorioamnionitis . Delivery was complicated by none. Please see delivery/op note for additional details. Her postpartum course was uncomplicated. She was breastfeeding without difficulty. By day of discharge, she was passing flatus, urinating, eating and drinking without difficulty. Her pain was well-controlled, and she was discharged home with ibuprofen and 5-day supply of percocet. She was also discharged home to continue UTI prophylaxis given kidney stones and pyelonephritis in pregnancy. This will likely be able to be discontinued at her postpartum visit. She will follow-up in clinic in 10 days for incision check and 4-6 weeks for postpartum visit.   Physical exam  Vitals:   12/19/17 0540 12/19/17 1515 12/19/17 2208 12/20/17 0628  BP: 130/73 105/62 103/63 111/72  Pulse: 72 70 75 81  Resp: 18 18 18 18   Temp: 98 F (36.7 C) 98.4 F (36.9 C) 98.5 F (36.9  C) 98.1 F (36.7 C)  TempSrc: Oral  Oral Oral  SpO2: 100%     Weight:      Height:       General: well-appearing, walking in room  Lochia: appropriate Uterine Fundus: firm Incision: Dressing is clean, dry, and intact DVT Evaluation: No evidence of DVT seen on physical exam. Labs: Lab Results  Component Value Date   WBC 15.3 (H) 12/19/2017   HGB 9.1 (L) 12/19/2017   HCT 28.4 (L) 12/19/2017   MCV 83.3 12/19/2017   PLT 263 12/19/2017   CMP Latest Ref Rng & Units 12/19/2017  Glucose 70 - 99 mg/dL 409(W)  BUN 6 - 20 mg/dL 8  Creatinine 1.19 - 1.47 mg/dL 8.29  Sodium 562 - 130 mmol/L 136  Potassium 3.5 - 5.1 mmol/L 3.9  Chloride 98 - 111 mmol/L 106  CO2 22 - 32 mmol/L 23  Calcium 8.9 - 10.3 mg/dL 8.6(V)    Discharge instructions: Per After Visit Summary and "Baby and Me Booklet"  After visit meds:  Allergies as of 12/20/2017   No Known Allergies     Medication List    TAKE these medications   acetaminophen 325 MG tablet Commonly known as:  TYLENOL Take 650 mg by mouth every 6 (six) hours as needed for mild pain.   calcium carbonate 1250 (500 Ca) MG chewable tablet Commonly known as:  OS-CAL Chew 2 tablets by mouth daily.   cephALEXin 500 MG capsule Commonly known as:  KEFLEX Take 500 mg by mouth at bedtime. For UTI continue until delivery  diphenhydramine-acetaminophen 25-500 MG Tabs tablet Commonly known as:  TYLENOL PM Take 1 tablet by mouth at bedtime as needed (sleep).   ibuprofen 600 MG tablet Commonly known as:  ADVIL,MOTRIN Take 1 tablet (600 mg total) by mouth every 8 (eight) hours.   norethindrone 0.35 MG tablet Commonly known as:  MICRONOR,CAMILA,ERRIN Take 1 tablet (0.35 mg total) by mouth daily. Start 2 weeks postpartum. Start taking on:  01/05/2018   oxyCODONE-acetaminophen 5-325 MG tablet Commonly known as:  PERCOCET/ROXICET Take 1 tablet by mouth every 6 (six) hours as needed for up to 5 days.   prenatal multivitamin Tabs tablet Take 1  tablet by mouth daily at 12 noon.       Postpartum contraception: Progesterone only pills Diet: Routine Diet Activity: Advance as tolerated. Pelvic rest for 6 weeks.   Outpatient follow up: 4-6 weeks as well as incision check in 10 days   Newborn Data: Live born female  Birth Weight: 8 lb 7.6 oz (3845 g) APGAR: 8, 9  Newborn Delivery   Birth date/time:  12/18/2017 00:07:00 Delivery type:  C-Section, Low Vertical Trial of labor:  Yes C-section categorization:  Primary    Baby Feeding: Breast Disposition:home with mother  Cristal Deer. Earlene Plater, DO OB/GYN Fellow, Faculty Practice

## 2017-12-20 NOTE — Lactation Note (Signed)
This note was copied from a baby's chart. Lactation Consultation Note  Patient Name: Melanie Warner UEAVW'U Date: 12/20/2017 Reason for consult: Follow-up assessment;Difficult latch;Term P1, 71 hours female infant weight loss -7% Per  mom, only pumped 2 times in past 24 hours. LC notice higher  volume of colostrum expressed than yesterday. LC unable assess latch at this time, per mom BF 30 minutes then supplemented  afterwards with 20 ml of Gerber Goodstart fomula. Parents have changed feeding choice to breastfeeding and supplementing with formula. Received LEAD counseling from nurse.  Infant had 2 wet and one soiled diaper in past 24 hours. Mom will continue work with latching infant to breast, feels he does well when nurse and LC present. LC reviewed latching techniques, infant mouth wide when latching, tongue down, infant hands hugging breast, listen for swallows, mom should feel a tug but no pain at breast and infant seems content after feeding.   Mom plan: 1. BF according hunger cues, 8 to 12 times within 24 hours including nights. 2. Mom will BF first and then supplement with formula afterwards. 3. Continue with STS 4. Continue working with latching infant to breast call for assistance with nurse or LC. 5. Consistent with pumping 5 or 6 times after feeding infant to help with breast stimulation and induction.  Maternal Data Formula Feeding for Exclusion: No  Feeding    LATCH Score Latch: Repeated attempts needed to sustain latch, nipple held in mouth throughout feeding, stimulation needed to elicit sucking reflex.  Audible Swallowing: A few with stimulation  Type of Nipple: Everted at rest and after stimulation(short shafted)  Comfort (Breast/Nipple): Filling, red/small blisters or bruises, mild/mod discomfort  Hold (Positioning): Full assist, staff holds infant at breast  LATCH Score: 5  Interventions    Lactation Tools Discussed/Used     Consult  Status Consult Status: Follow-up Date: 12/21/17 Follow-up type: In-patient    Melanie Warner 12/20/2017, 11:10 PM

## 2017-12-20 NOTE — Lactation Note (Addendum)
This note was copied from a baby's chart. Lactation Consultation Note  Patient Name: Melanie Warner UEAVW'U Date: 12/20/2017 Reason for consult: Mother's request;Difficult latch;Term P2, 54 hr female infant, weight loss -7%, c/s delivery. Per mom, sore nipples and LC notice abrasion on right breast mom was given comfort gels and explained how to use them. Nurse will give mom coconut oil. LC assisted mom in latching infant to left breast in cross-cradle hold, ask mom wait until infant mouth has a wide gape and tongue down. Infant latched and audible swallowing was heard by [facility] and parents. Mom BF 15 mins. Mom felt infant latch well and she not longer was experience pain with latch.  Mom's plan: 1. BF according hunger cues, 8 to 12 times within 24 hours. 2. Work toward improving latch, infant mouth is wide and tongue down with  chin to breast. 3. Mom will call for assistance from Nurse or LC if she needs assistance with latching infant to breast. 4, Mom will wear comfort gels and use coconut oil to help with healing and soothing sore  breast w/ abrasions.   Maternal Data Formula Feeding for Exclusion: No  Feeding    LATCH Score Latch: Grasps breast easily, tongue down, lips flanged, rhythmical sucking.  Audible Swallowing: Spontaneous and intermittent  Type of Nipple: Everted at rest and after stimulation(Short shafted )  Comfort (Breast/Nipple): Filling, red/small blisters or bruises, mild/mod discomfort  Hold (Positioning): Assistance needed to correctly position infant at breast and maintain latch.  LATCH Score: 8  Interventions Interventions: Assisted with latch;Support pillows;Adjust position;Position options;Comfort gels  Lactation Tools Discussed/Used     Consult Status Consult Status: Follow-up Date: 12/20/17 Follow-up type: In-patient    Danelle Earthly 12/20/2017, 6:14 AM

## 2017-12-20 NOTE — Lactation Note (Signed)
This note was copied from a baby's chart. Lactation Consultation Note  Patient Name: Melanie Warner Today's Date: 12/20/2017   Infant with 7 percent weight loss and mom with bilateral compression stripes/redness and pain.  Left nipple also with abrasion. infant with rising bilirubin Dad letting infant suck on his finger.  Mom reports she does not feel like she has enough milk. Discussed adding hand expression and pumping to breastfeeding.  Mom reports she tried to pump once and did not get anything. Assisted with breastfeeding on left breast in laid back.  Mom reports comfort.  Infant had rythmic sucking and appeared to have swallows but no audible swallows heard.  Infant came off/nipple round on release.  Initiated pumping with mom/few drops of colostrum obtained/fed  Back to infant via spoon. Left mom and infant STS.  Urged mom to feed on cue and every 3 hours. And pumtop and hand express pasr breastfeeding and feed back all EMM.  Mom to call as needed for lactation services. Maternal Data    Feeding Length of feed: 11 min  LATCH Score                   Interventions    Lactation Tools Discussed/Used     Consult Status      Celester Morgan Michaelle Copas 12/20/2017, 7:34 PM

## 2017-12-21 MED ORDER — FERROUS SULFATE 325 (65 FE) MG PO TABS: 325.0000 mg | ORAL_TABLET | Freq: Every day | ORAL | 0 refills | Status: AC

## 2017-12-21 NOTE — Discharge Summary (Signed)
Obstetrics Discharge Summary OB/GYN Faculty Practice   Patient Name: Melanie Warner DOB: 02-Oct-1980 MRN: [id number]  Date of admission: 12/16/2017 Delivering MD: [name]   Date of discharge: 12/21/2017  Admitting diagnosis: LEAKING FLUIDS Intrauterine pregnancy: [redacted]w[redacted]d     Secondary diagnosis:   Active Problems:   AMA (advanced maternal age) multigravida 35+   Bilateral nephrolithiasis   Pyelonephritis affecting pregnancy in second trimester   Indication for care in labor and delivery, antepartum   Post-dates pregnancy   Status post primary low transverse cesarean section                                      Discharge diagnosis: Cesarean delivery for prolonged second stage and failure to descend                         Postpartum procedures: None  Complications: none   Hospital course: Melanie Warner is a 37 y.o. [redacted]w[redacted]d who was admitted for induction for post-dates non non-reactive NST. Her pregnancy was complicated by AMA, kidney stones and pyelonephritis in pregnancy. Her labor course was notable for pitocin, AROM, prolonged second stage, chorioamnionitis . Delivery was complicated by none. Please see delivery/op note for additional details. Her postpartum course was uncomplicated. She was breastfeeding without difficulty. By day of discharge, she was passing flatus, urinating, eating and drinking without difficulty. Her pain was well-controlled, and she was discharged home with ibuprofen and 5-day supply of percocet. She was also discharged home to continue UTI prophylaxis given kidney stones and pyelonephritis in pregnancy. This will likely be able to be discontinued at her postpartum visit. She will follow-up in clinic in 10 days for incision check and 4-6 weeks for postpartum visit.   Physical exam        Vitals:   12/19/17 0540 12/19/17 1515 12/19/17 2208 12/20/17 0628  BP: 130/73 105/62 103/63 111/72  Pulse: 72 70 75 81  Resp: 18 18 18 18    Temp: 98 F (36.7 C) 98.4 F (36.9 C) 98.5 F (36.9 C) 98.1 F (36.7 C)  TempSrc: Oral  Oral Oral  SpO2: 100%     Weight:      Height:       General: well-appearing, walking in room  Lochia: appropriate Uterine Fundus: firm Incision: Dressing is clean, dry, and intact DVT Evaluation: No evidence of DVT seen on physical exam. Labs: RecentLabs       Lab Results  Component Value Date   WBC 15.3 (H) 12/19/2017   HGB 9.1 (L) 12/19/2017   HCT 28.4 (L) 12/19/2017   MCV 83.3 12/19/2017   PLT 263 12/19/2017     CMP Latest Ref Rng & Units 12/19/2017  Glucose 70 - 99 mg/dL 409(W)  BUN 6 - 20 mg/dL 8  Creatinine 1.19 - 1.47 mg/dL 8.29  Sodium 562 - 130 mmol/L 136  Potassium 3.5 - 5.1 mmol/L 3.9  Chloride 98 - 111 mmol/L 106  CO2 22 - 32 mmol/L 23  Calcium 8.9 - 10.3 mg/dL 8.6(V)    Discharge instructions: Per After Visit Summary and "Baby and Me Booklet"  After visit meds:  Allergies as of 12/20/2017   No Known Allergies        Medication List    TAKE these medications   acetaminophen 325 MG tablet Commonly known as:  TYLENOL Take 650 mg by mouth every 6 (six) hours  as needed for mild pain.   calcium carbonate 1250 (500 Ca) MG chewable tablet Commonly known as:  OS-CAL Chew 2 tablets by mouth daily.   cephALEXin 500 MG capsule Commonly known as:  KEFLEX Take 500 mg by mouth at bedtime. For UTI continue until delivery   diphenhydramine-acetaminophen 25-500 MG Tabs tablet Commonly known as:  TYLENOL PM Take 1 tablet by mouth at bedtime as needed (sleep).   ibuprofen 600 MG tablet Commonly known as:  ADVIL,MOTRIN Take 1 tablet (600 mg total) by mouth every 8 (eight) hours.   norethindrone 0.35 MG tablet Commonly known as:  MICRONOR,CAMILA,ERRIN Take 1 tablet (0.35 mg total) by mouth daily. Start 2 weeks postpartum. Start taking on:  01/05/2018   oxyCODONE-acetaminophen 5-325 MG tablet Commonly known as:   PERCOCET/ROXICET Take 1 tablet by mouth every 6 (six) hours as needed for up to 5 days.   prenatal multivitamin Tabs tablet Take 1 tablet by mouth daily at 12 noon.       Postpartum contraception: Progesterone only pills Diet: Routine Diet Activity: Advance as tolerated. Pelvic rest for 6 weeks.   Outpatient follow up: 4-6 weeks as well as incision check in 10 days   Newborn Data: Live born female  Birth Weight: 8 lb 7.6 oz (3845 g) APGAR: 8, 9  Newborn Delivery   Birth date/time:  12/18/2017 00:07:00 Delivery type:  C-Section, Low Vertical Trial of labor:  Yes C-section categorization:  Primary    Baby Feeding: Breast Disposition:home with mother

## 2017-12-21 NOTE — Lactation Note (Signed)
This note was copied from a baby's chart. Lactation Consultation Note  Patient Name: Melanie Warner [id number] Date: 12/21/2017 Reason for consult: Follow-up assessment;Term;Infant weight loss  P2 mother whose infant is now 46 hours old.  This is a term baby with a 10% weight loss  Baby was sleeping in father's arms when I arrived.  Parents stated that they could tell baby was not "feeling full."  He was fussy and not content after breastfeeding.  They were able to obtain formula last night and after supplementing with the formula baby began to be content.  Father prefers to use the curved tip syringe for supplementation.  I reviewed the feeding supplementation guidelines with them and made sure they understood that [name] could take more than the recommendations due to his weight loss.  I also suggested the foley cup instead of the syringe now that the volumes have increased greatly.  I will demonstrated this for them prior to discharge and they can have this as an alternate method of feeding.  Mother feels more relieved now that she can see colostrum with pumping.  She will continue to feed baby 8-12 times/24 hours or sooner if he shows feeding cues.  She is familiar with hand expression and will do this before/after feedings to help increase milk supply.  She will feed back any EBM she obtains with hand expression.    Engorgement prevention/treatment discussed.  Mother has the Medela manual pump and will be obtaining a DEBP for home use.  I have informed them of the gift shop pump rentals if desired.  Father is present and very supportive.  He uses his critical thinking skills to assess the situation and, together, the parents work well.  They will bring [name] back to the pediatrician tomorrow for his first visit.    Mother has our OP phone number if any questions/concerns arise after discharge.  RN updated.  Dr. Ezequiel Essex and I discussed this situation and I feel confident that parents will  feed as discussed.  They will be discharged home today.       Maternal Data Formula Feeding for Exclusion: No Has patient been taught Hand Expression?: Yes Does the patient have breastfeeding experience prior to this delivery?: Yes  Feeding Feeding Type: Formula  LATCH Score Latch: Repeated attempts needed to sustain latch, nipple held in mouth throughout feeding, stimulation needed to elicit sucking reflex.  Audible Swallowing: A few with stimulation  Type of Nipple: Flat  Comfort (Breast/Nipple): Soft / non-tender  Hold (Positioning): Assistance needed to correctly position infant at breast and maintain latch.  LATCH Score: 6  Interventions    Lactation Tools Discussed/Used WIC Program: No   Consult Status Consult Status: Complete Date: 12/21/17 Follow-up type: Call as needed    Daneesha Quinteros R Ladana Chavero 12/21/2017, 12:08 PM

## 2018-01-01 ENCOUNTER — Ambulatory Visit: Payer: Self-pay

## 2018-01-08 ENCOUNTER — Telehealth: Payer: Self-pay | Admitting: Family Medicine

## 2018-01-08 NOTE — Telephone Encounter (Signed)
Patient called to say she needed to schedule her appointment. I informed her she missed her incision check. She stated it "it's fine". I scheduled her for her postpartum visit.

## 2018-01-12 ENCOUNTER — Ambulatory Visit: Payer: Self-pay | Admitting: Family Medicine

## 2018-01-12 DIAGNOSIS — Z5321 Procedure and treatment not carried out due to patient leaving prior to being seen by health care provider: Secondary | ICD-10-CM

## 2018-01-12 NOTE — Progress Notes (Signed)
At 1820 went to lobby to tell pt provider running behind and would get pt back as soon as possible. Pt not in lobby.

## 2018-01-12 NOTE — Progress Notes (Signed)
Went to lobby to call pt to clinic room and pt not in lobby. Antoinette in registration will let nurses know if pt returns

## 2018-01-12 NOTE — Progress Notes (Signed)
Pt still not in lobby.

## 2018-01-26 ENCOUNTER — Inpatient Hospital Stay (HOSPITAL_COMMUNITY)
Admission: AD | Admit: 2018-01-26 | Discharge: 2018-01-26 | Disposition: A | Discharge status: Home / Self Care | Payer: Self-pay | Source: Ambulatory Visit | Attending: Obstetrics and Gynecology | Admitting: Obstetrics and Gynecology

## 2018-01-26 ENCOUNTER — Encounter (HOSPITAL_COMMUNITY): Payer: Self-pay | Admitting: *Deleted

## 2018-01-26 DIAGNOSIS — F53 Postpartum depression: Secondary | ICD-10-CM

## 2018-01-26 DIAGNOSIS — R1084 Generalized abdominal pain: Secondary | ICD-10-CM

## 2018-01-26 DIAGNOSIS — R52 Pain, unspecified: Secondary | ICD-10-CM

## 2018-01-26 DIAGNOSIS — O99345 Other mental disorders complicating the puerperium: Secondary | ICD-10-CM

## 2018-01-26 LAB — URINALYSIS, ROUTINE W REFLEX MICROSCOPIC
BILIRUBIN URINE: NEGATIVE
GLUCOSE, UA: NEGATIVE mg/dL
Ketones, ur: NEGATIVE mg/dL
Nitrite: NEGATIVE
Protein, ur: NEGATIVE mg/dL
SPECIFIC GRAVITY, URINE: 1.011 (ref 1.005–1.030)
pH: 7 (ref 5.0–8.0)

## 2018-01-26 LAB — CBC WITH DIFFERENTIAL/PLATELET
Basophils Absolute: 0 10*3/uL (ref 0.0–0.1)
Basophils Relative: 0 %
Eosinophils Absolute: 0.1 10*3/uL (ref 0.0–0.5)
Eosinophils Relative: 1 %
HEMATOCRIT: 38.5 % (ref 36.0–46.0)
Hemoglobin: 12.3 g/dL (ref 12.0–15.0)
LYMPHS ABS: 1.4 10*3/uL (ref 0.7–4.0)
Lymphocytes Relative: 16 %
MCH: 26.5 pg (ref 26.0–34.0)
MCHC: 31.9 g/dL (ref 30.0–36.0)
MCV: 83 fL (ref 80.0–100.0)
MONO ABS: 0.4 10*3/uL (ref 0.1–1.0)
MONOS PCT: 4 %
NEUTROS ABS: 6.8 10*3/uL (ref 1.7–7.7)
Neutrophils Relative %: 79 %
Platelets: 264 10*3/uL (ref 150–400)
RBC: 4.64 MIL/uL (ref 3.87–5.11)
RDW: 15.6 % — AB (ref 11.5–15.5)
WBC: 8.6 10*3/uL (ref 4.0–10.5)
nRBC: 0 % (ref 0.0–0.2)

## 2018-01-26 LAB — COMPREHENSIVE METABOLIC PANEL
ALK PHOS: 74 U/L (ref 38–126)
ALT: 42 U/L (ref 0–44)
ANION GAP: 9 (ref 5–15)
AST: 38 U/L (ref 15–41)
Albumin: 4.1 g/dL (ref 3.5–5.0)
BUN: 12 mg/dL (ref 6–20)
CALCIUM: 9 mg/dL (ref 8.9–10.3)
CO2: 23 mmol/L (ref 22–32)
Chloride: 105 mmol/L (ref 98–111)
Creatinine, Ser: 0.62 mg/dL (ref 0.44–1.00)
GFR calc Af Amer: >60 mL/min (ref >60)
Glucose, Bld: 95 mg/dL (ref 70–99)
Potassium: 3.8 mmol/L (ref 3.5–5.1)
Sodium: 137 mmol/L (ref 135–145)
TOTAL PROTEIN: 7.4 g/dL (ref 6.5–8.1)
Total Bilirubin: 0.8 mg/dL (ref 0.3–1.2)

## 2018-01-26 LAB — POCT PREGNANCY, URINE: Preg Test, Ur: NEGATIVE

## 2018-01-26 LAB — INFLUENZA PANEL BY PCR (TYPE A & B)
INFLAPCR: NEGATIVE
Influenza B By PCR: NEGATIVE

## 2018-01-26 MED ORDER — SERTRALINE HCL 50 MG PO TABS: 50.0000 mg | ORAL_TABLET | Freq: Every day | ORAL | 1 refills | Status: AC

## 2018-01-26 MED ORDER — KETOROLAC TROMETHAMINE 60 MG/2ML IM SOLN
60.0000 mg | Freq: Once | INTRAMUSCULAR | Status: AC
  Administered 2018-01-26: 60 mg via INTRAMUSCULAR
  Filled 2018-01-26: qty 2

## 2018-01-26 NOTE — MAU Provider Note (Signed)
History     CSN: [id number]  Arrival date and time: 01/26/18 1024   None     Chief Complaint  Patient presents with  . Fever  . Generalized Body Aches  . Incisional Pain   HPI   Melanie Warner is a 37 y.o. non pregnant female here with fever, generalized body aches that started Thursday. Says she checked her temp last night and it 103.4; she took ibuprofen last night, none today. Says she feels body aches from her neck down to her feet. No one in her house is sick. Says  She has hd incisional pain since her C-section back in Sept 2019.   Patient says she has not had energy to get out of bed since delivery. Says she is under a lot of stress because she is at home with 2 children. She was given Zoloft however she stopped it because it made her to sleepy. She declines thoughts of harm to herself or children.   OB History    Gravida  2   Para  2   Term  2   Preterm  0   AB  0   Living  2     SAB  0   TAB  0   Ectopic  0   Multiple  0   Live Births  2           Past Medical History:  Diagnosis Date  . Chronic kidney disease    kidney infection with first pregnancy  . Depression   . Hx of pyelonephritis during pregnancy 2015, 2019    Past Surgical History:  Procedure Laterality Date  . BREAST LUMPECTOMY  2007  . BREAST SURGERY    . CESAREAN SECTION N/A 12/17/2017   Procedure: CESAREAN SECTION;  Surgeon: Jonnie Kind, MD;  Location: [facility];  Service: Obstetrics;  Laterality: N/A;    Family History  Problem Relation Age of Onset  . Hypertension Father     Social History   Tobacco Use  . Smoking status: Never Smoker  . Smokeless tobacco: Never Used  Substance Use Topics  . Alcohol use: No  . Drug use: No    Allergies: No Known Allergies  Medications Prior to Admission  Medication Sig Dispense Refill Last Dose  . acetaminophen (TYLENOL) 325 MG tablet Take 650 mg by mouth every 6 (six) hours as needed for mild pain.    Past Month at Unknown time  . calcium carbonate (OS-CAL) 1250 (500 Ca) MG chewable tablet Chew 2 tablets by mouth daily.    12/15/2017 at Unknown time  . cephALEXin (KEFLEX) 500 MG capsule Take 500 mg by mouth at bedtime. For UTI continue until delivery   12/15/2017 at Unknown time  . diphenhydramine-acetaminophen (TYLENOL PM) 25-500 MG TABS tablet Take 1 tablet by mouth at bedtime as needed (sleep).   Past Month at Unknown time  . ferrous sulfate 325 (65 FE) MG tablet Take 1 tablet (325 mg total) by mouth daily. 30 tablet 0   . ibuprofen (ADVIL,MOTRIN) 600 MG tablet Take 1 tablet (600 mg total) by mouth every 8 (eight) hours. 30 tablet 0   . norethindrone (MICRONOR,CAMILA,ERRIN) 0.35 MG tablet Take 1 tablet (0.35 mg total) by mouth daily. Start 2 weeks postpartum. 1 Package 11   . Prenatal Vit-Fe Fumarate-FA (PRENATAL MULTIVITAMIN) TABS tablet Take 1 tablet by mouth daily at 12 noon.   12/16/2017 at Unknown time    Results for orders placed or performed during the  hospital encounter of 01/26/18 (from the past 48 hour(s))  Urinalysis, Routine w reflex microscopic     Status: Abnormal   Collection Time: 01/26/18 11:11 AM  Result Value Ref Range   Color, Urine YELLOW YELLOW   APPearance CLEAR CLEAR   Specific Gravity, Urine 1.011 1.005 - 1.030   pH 7.0 5.0 - 8.0   Glucose, UA NEGATIVE NEGATIVE mg/dL   Hgb urine dipstick MODERATE (A) NEGATIVE   Bilirubin Urine NEGATIVE NEGATIVE   Ketones, ur NEGATIVE NEGATIVE mg/dL   Protein, ur NEGATIVE NEGATIVE mg/dL   Nitrite NEGATIVE NEGATIVE   Leukocytes, UA TRACE (A) NEGATIVE   RBC / HPF 0-5 0 - 5 RBC/hpf   WBC, UA 11-20 0 - 5 WBC/hpf   Bacteria, UA RARE (A) NONE SEEN   Squamous Epithelial / LPF 6-10 0 - 5   Mucus PRESENT    Non Squamous Epithelial 0-5 (A) NONE SEEN    Comment: Performed at [facility], [address], [place], [place] [address]  Pregnancy, urine POC     Status: None   Collection Time: 01/26/18 11:15 AM  Result Value Ref  Range   Preg Test, Ur NEGATIVE NEGATIVE    Comment:        THE SENSITIVITY OF THIS METHODOLOGY IS >24 mIU/mL   CBC with Differential     Status: Abnormal   Collection Time: 01/26/18 11:48 AM  Result Value Ref Range   WBC 8.6 4.0 - 10.5 K/uL   RBC 4.64 3.87 - 5.11 MIL/uL   Hemoglobin 12.3 12.0 - 15.0 g/dL   HCT 38.5 36.0 - 46.0 %   MCV 83.0 80.0 - 100.0 fL   MCH 26.5 26.0 - 34.0 pg   MCHC 31.9 30.0 - 36.0 g/dL   RDW 15.6 (H) 11.5 - 15.5 %   Platelets 264 150 - 400 K/uL   nRBC 0.0 0.0 - 0.2 %   Neutrophils Relative % 79 %   Neutro Abs 6.8 1.7 - 7.7 K/uL   Lymphocytes Relative 16 %   Lymphs Abs 1.4 0.7 - 4.0 K/uL   Monocytes Relative 4 %   Monocytes Absolute 0.4 0.1 - 1.0 K/uL   Eosinophils Relative 1 %   Eosinophils Absolute 0.1 0.0 - 0.5 K/uL   Basophils Relative 0 %   Basophils Absolute 0.0 0.0 - 0.1 K/uL    Comment: Performed at [facility], [address] [place], [place] [address]  Comprehensive metabolic panel     Status: None   Collection Time: 01/26/18 11:48 AM  Result Value Ref Range   Sodium 137 135 - 145 mmol/L   Potassium 3.8 3.5 - 5.1 mmol/L   Chloride 105 98 - 111 mmol/L   CO2 23 22 - 32 mmol/L   Glucose, Bld 95 70 - 99 mg/dL   BUN 12 6 - 20 mg/dL   Creatinine, Ser 0.62 0.44 - 1.00 mg/dL   Calcium 9.0 8.9 - 10.3 mg/dL   Total Protein 7.4 6.5 - 8.1 g/dL   Albumin 4.1 3.5 - 5.0 g/dL   AST 38 15 - 41 U/L   ALT 42 0 - 44 U/L   Alkaline Phosphatase 74 38 - 126 U/L   Total Bilirubin 0.8 0.3 - 1.2 mg/dL   GFR calc non Af Amer >60 >60 mL/min   GFR calc Af Amer >60 >60 mL/min    Comment: (NOTE) The eGFR has been calculated using the CKD EPI equation. This calculation has not been validated in all clinical situations.  eGFR's persistently <60 mL/min signify possible Chronic Kidney Disease.    Anion gap 9 5 - 15    Comment: Performed at [facility], [address], [place], [place] [address]  Influenza panel by PCR (type A & B)     Status: None    Collection Time: 01/26/18 12:04 PM  Result Value Ref Range   Influenza A By PCR NEGATIVE NEGATIVE   Influenza B By PCR NEGATIVE NEGATIVE    Comment: (NOTE) The Xpert Xpress Flu assay is intended as an aid in the diagnosis of  influenza and should not be used as a sole basis for treatment.  This  assay is FDA approved for nasopharyngeal swab specimens only. Nasal  washings and aspirates are unacceptable for Xpert Xpress Flu testing. Performed at [facility], [address], [place], [place] [address]     Review of Systems  Constitutional: Positive for fatigue and fever.  Genitourinary: Negative for dysuria.  Musculoskeletal: Positive for back pain.  Psychiatric/Behavioral: Negative for self-injury and suicidal ideas. The patient is nervous/anxious.    Physical Exam   Blood pressure 122/79, pulse 97, temperature 98.4 F (36.9 C), temperature source Oral, resp. rate 18, weight 71.3 kg, unknown if currently breastfeeding.  Physical Exam  Constitutional: She is oriented to person, place, and time. She appears well-developed and well-nourished. No distress.  Respiratory: Effort normal and breath sounds normal.  GI: Soft. She exhibits no distension. There is tenderness (Mild incisional pain with palpation. No erythema, drainage. No odor. ). There is no rebound, no guarding and no CVA tenderness.  Genitourinary:  Genitourinary Comments: Bimanual exam: Cervix closed, posterior. No CMT  Uterus non tender, normal size Adnexa non tender, no masses bilaterally Chaperone present for exam.   Musculoskeletal: Normal range of motion.  Neurological: She is alert and oriented to person, place, and time.  Skin: Skin is warm. She is not diaphoretic.  Psychiatric: Her behavior is normal.   MAU Course  Procedures  None  MDM  Toradol 60 mg IM  Influenza swab negative.  CBC with diff CMP UA with urine culture.  Pain down from 7/10-2/10.  Reported fever, however no documented fever  today. WBC WNL, pain better with Toradol. Discussed treatment for postpartum depression and patient is agreeable.   Assessment and Plan   A:  1. Generalized body aches   2. Postpartum depression   3. Generalized abdominal pain    P:  Discharge home in stable condition Information given on counselors/ therapists Rx: Zoloft, take at bedtime Return to MAU if symptoms worsen Make f/u visit in [place], Artist Pais, NP 01/26/2018 2:14 PM

## 2018-01-26 NOTE — Discharge Instructions (Signed)
Postpartum Depression and Baby Blues The postpartum period begins right after the birth of a baby. During this time, there is often a great amount of joy and excitement. It is also a time of many changes in the life of the parents. Regardless of how many times a mother gives birth, each child brings new challenges and dynamics to the family. It is not unusual to have feelings of excitement along with confusing shifts in moods, emotions, and thoughts. All mothers are at risk of developing postpartum depression or the "baby blues." These mood changes can occur right after giving birth, or they may occur many months after giving birth. The baby blues or postpartum depression can be mild or severe. Additionally, postpartum depression can go away rather quickly, or it can be a long-term condition. What are the causes? Raised hormone levels and the rapid drop in those levels are thought to be a main cause of postpartum depression and the baby blues. A number of hormones change during and after pregnancy. Estrogen and progesterone usually decrease right after the delivery of your baby. The levels of thyroid hormone and various cortisol steroids also rapidly drop. Other factors that play a role in these mood changes include major life events and genetics. What increases the risk? If you have any of the following risks for the baby blues or postpartum depression, know what symptoms to watch out for during the postpartum period. Risk factors that may increase the likelihood of getting the baby blues or postpartum depression include:  Having a personal or family history of depression.  Having depression while being pregnant.  Having premenstrual mood issues or mood issues related to oral contraceptives.  Having a lot of life stress.  Having marital conflict.  Lacking a social support network.  Having a baby with special needs.  Having health problems, such as diabetes.  What are the signs or  symptoms? Symptoms of baby blues include:  Brief changes in mood, such as going from extreme happiness to sadness.  Decreased concentration.  Difficulty sleeping.  Crying spells, tearfulness.  Irritability.  Anxiety.  Symptoms of postpartum depression typically begin within the first month after giving birth. These symptoms include:  Difficulty sleeping or excessive sleepiness.  Marked weight loss.  Agitation.  Feelings of worthlessness.  Lack of interest in activity or food.  Postpartum psychosis is a very serious condition and can be dangerous. Fortunately, it is rare. Displaying any of the following symptoms is cause for immediate medical attention. Symptoms of postpartum psychosis include:  Hallucinations and delusions.  Bizarre or disorganized behavior.  Confusion or disorientation.  How is this diagnosed? A diagnosis is made by an evaluation of your symptoms. There are no medical or lab tests that lead to a diagnosis, but there are various questionnaires that a health care provider may use to identify those with the baby blues, postpartum depression, or psychosis. Often, a screening tool called the Edinburgh Postnatal Depression Scale is used to diagnose depression in the postpartum period. How is this treated? The baby blues usually goes away on its own in 1-2 weeks. Social support is often all that is needed. You will be encouraged to get adequate sleep and rest. Occasionally, you may be given medicines to help you sleep. Postpartum depression requires treatment because it can last several months or longer if it is not treated. Treatment may include individual or group therapy, medicine, or both to address any social, physiological, and psychological factors that may play a role in the   depression. Regular exercise, a healthy diet, rest, and social support may also be strongly recommended. Postpartum psychosis is more serious and needs treatment right away.  Hospitalization is often needed. Follow these instructions at home:  Get as much rest as you can. Nap when the baby sleeps.  Exercise regularly. Some women find yoga and walking to be beneficial.  Eat a balanced and nourishing diet.  Do little things that you enjoy. Have a cup of tea, take a bubble bath, read your favorite magazine, or listen to your favorite music.  Avoid alcohol.  Ask for help with household chores, cooking, grocery shopping, or running errands as needed. Do not try to do everything.  Talk to people close to you about how you are feeling. Get support from your partner, family members, friends, or other new moms.  Try to stay positive in how you think. Think about the things you are grateful for.  Do not spend a lot of time alone.  Only take over-the-counter or prescription medicine as directed by your health care provider.  Keep all your postpartum appointments.  Let your health care provider know if you have any concerns. Contact a health care provider if: You are having a reaction to or problems with your medicine. Get help right away if:  You have suicidal feelings.  You think you may harm the baby or someone else. This information is not intended to replace advice given to you by your health care provider. Make sure you discuss any questions you have with your health care provider. Document Released: 12/14/2003 Document Revised: 08/17/2015 Document Reviewed: 12/21/2012 Elsevier Interactive Patient Education  2017 Elsevier Inc.  

## 2018-01-26 NOTE — MAU Note (Signed)
Patient reports that she had a C/S 12/18/17 and had been taking ibuprofen for pain until two weeks ago, but then last week she started feeling incision pain again, as well as generalized body aches "from my legs, to my back, and up to my head." Patient also c/o of fever for past few days with fever 102.4 last night.  Reports that ibuprofen helps a little bit, but pain has not gone away.  Pt has not taken any medicine for pain today.  Rates pain 7/10.

## 2018-01-29 LAB — URINE CULTURE
Culture: 30000 — AB
SPECIAL REQUESTS: NORMAL

## 2018-01-31 ENCOUNTER — Telehealth: Payer: Self-pay | Admitting: Advanced Practice Midwife

## 2018-01-31 NOTE — Telephone Encounter (Signed)
[id number] s/p primary C/S on 12/18/17 was seen in MAU on 11/4 for body aches including abdominal pain and fever. Flu swab negative. No sick contacts.  Urine culture with 30,000 colonies E coli.  With pt presentation, attempted to call her to see if symptoms improved. Consider abx therapy for UTI if pt is symptomatic.  Phone number is not in service.

## 2018-04-12 ENCOUNTER — Ambulatory Visit (HOSPITAL_COMMUNITY)
Admission: EM | Admit: 2018-04-12 | Discharge: 2018-04-12 | Disposition: A | Discharge status: Home / Self Care | Payer: Self-pay | Attending: Internal Medicine | Admitting: Internal Medicine

## 2018-04-12 ENCOUNTER — Encounter (HOSPITAL_COMMUNITY): Payer: Self-pay

## 2018-04-12 ENCOUNTER — Other Ambulatory Visit: Payer: Self-pay

## 2018-04-12 DIAGNOSIS — H66001 Acute suppurative otitis media without spontaneous rupture of ear drum, right ear: Secondary | ICD-10-CM

## 2018-04-12 MED ORDER — AMOXICILLIN 875 MG PO TABS: 875.0000 mg | ORAL_TABLET | Freq: Two times a day (BID) | ORAL | 0 refills | Status: AC

## 2018-04-12 NOTE — ED Triage Notes (Signed)
Pt cc right ear infection. Pt states the pain is unbearable. Pt states she has a sore throat. X 2 days.

## 2018-04-12 NOTE — ED Provider Notes (Signed)
[facility]    CSN: [id number] Arrival date & time: 04/12/18  1352     History   Chief Complaint Chief Complaint  Patient presents with  . Otalgia  . Sore Throat    HPI Anae Warner is a 38 y.o. female.   38 year old female with history of kidney stones who is currently breast-feeding presents to urgent care complaining of pain in her right ear.  The pain also severe it kept her from sleeping last night.  She is also had fevers intermittently for the last 2 days.  She denies nausea, vomiting or diarrhea.  She also complains of sore throat.     Past Medical History:  Diagnosis Date  . Chronic kidney disease    kidney infection with first pregnancy  . Depression   . Hx of pyelonephritis during pregnancy 2015, 2019    Patient Active Problem List   Diagnosis Date Noted  . Status post primary low transverse cesarean section 12/18/2017  . Post-dates pregnancy 12/17/2017  . Indication for care in labor and delivery, antepartum 12/16/2017  . 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 Pyelonephritis affecting pregnancy in second trimester 08/24/2017  . AMA (advanced maternal age) multigravida 35+ 08/22/2017  . Bilateral nephrolithiasis 08/22/2017  . Breast lump 06/06/2017  . Supervision of other normal pregnancy, antepartum 06/05/2017    Past Surgical History:  Procedure Laterality Date  . BREAST LUMPECTOMY  2007  . BREAST SURGERY    . CESAREAN SECTION N/A 12/17/2017   Procedure: CESAREAN SECTION;  Surgeon: [name], MD;  Location: [facility];  Service: Obstetrics;  Laterality: N/A;    OB History    Gravida  2   Para  2   Term  2   Preterm  0   AB  0   Living  2     SAB  0   TAB  0   Ectopic  0   Multiple  0   Live Births  2            Home Medications    Prior to Admission medications   Medication Sig Start Date End Date Taking? Authorizing Provider  acetaminophen (TYLENOL) 325 MG tablet Take 650 mg by  mouth every 6 (six) hours as needed for mild pain.    [provider]  amoxicillin (AMOXIL) 875 MG tablet Take 1 tablet (875 mg total) by mouth 2 (two) times daily for 10 days. 04/12/18 04/22/18  Arnaldo Natal, MD  calcium carbonate (OS-CAL) 1250 (500 Ca) MG chewable tablet Chew 2 tablets by mouth daily.     [provider]  cephALEXin (KEFLEX) 500 MG capsule Take 500 mg by mouth at bedtime. For UTI continue until delivery    [provider]  diphenhydramine-acetaminophen (TYLENOL PM) 25-500 MG TABS tablet Take 1 tablet by mouth at bedtime as needed (sleep).    [provider]  ferrous sulfate 325 (65 FE) MG tablet Take 1 tablet (325 mg total) by mouth daily. Patient not taking: Reported on 04/12/2018 12/21/17   Montez Morita, CNM  ibuprofen (ADVIL,MOTRIN) 600 MG tablet Take 1 tablet (600 mg total) by mouth every 8 (eight) hours. Patient not taking: Reported on 04/12/2018 12/20/17   Tamera Stands, DO  norethindrone (MICRONOR,CAMILA,ERRIN) 0.35 MG tablet Take 1 tablet (0.35 mg total) by mouth daily. Start 2 weeks postpartum. Patient not taking: Reported on 04/12/2018 01/05/18   Tamera Stands, DO  Prenatal Vit-Fe Fumarate-FA (PRENATAL MULTIVITAMIN) TABS tablet  Take 1 tablet by mouth daily at 12 noon.    [provider]  sertraline (ZOLOFT) 50 MG tablet Take 1 tablet (50 mg total) by mouth at bedtime. Patient not taking: Reported on 04/12/2018 01/26/18 01/26/19  Rasch, Harolyn RutherfordJennifer I, NP    Family History Family History  Problem Relation Age of Onset  . Hypertension Father     Social History Social History   Tobacco Use  . Smoking status: Never Smoker  . Smokeless tobacco: Never Used  Substance Use Topics  . Alcohol use: No  . Drug use: No     Allergies   Patient has no known allergies.   Review of Systems Review of Systems  Constitutional: Negative for chills and fever.  HENT: Positive for ear pain and sore throat. Negative for  tinnitus.   Eyes: Negative for redness.  Respiratory: Negative for cough and shortness of breath.   Cardiovascular: Negative for chest pain and palpitations.  Gastrointestinal: Negative for abdominal pain, diarrhea, nausea and vomiting.  Genitourinary: Negative for dysuria, frequency and urgency.  Musculoskeletal: Negative for myalgias.  Skin: Negative for rash.       No lesions  Neurological: Negative for weakness.  Hematological: Does not bruise/bleed easily.  Psychiatric/Behavioral: Negative for suicidal ideas.     Physical Exam Triage Vital Signs ED Triage Vitals  Enc Vitals Group     BP 04/12/18 1536 139/90     Pulse Rate 04/12/18 1536 88     Resp 04/12/18 1536 16     Temp 04/12/18 1536 98 F (36.7 C)     Temp Source 04/12/18 1536 Tympanic     SpO2 04/12/18 1536 100 %     Weight 04/12/18 1538 135 lb (61.2 kg)     Height --      Head Circumference --      Peak Flow --      Pain Score 04/12/18 1537 9     Pain Loc --      Pain Edu? --      Excl. in GC? --    No data found.  Updated Vital Signs BP 139/90 (BP Location: Right Arm)   Pulse 88   Temp 98 F (36.7 C) (Tympanic)   Resp 16   Wt 61.2 kg   SpO2 100%   BMI 23.17 kg/m   Visual Acuity Right Eye Distance:   Left Eye Distance:   Bilateral Distance:    Right Eye Near:   Left Eye Near:    Bilateral Near:     Physical Exam Vitals signs and nursing note reviewed.  Constitutional:      General: She is not in acute distress.    Appearance: She is well-developed.  HENT:     Head: Normocephalic and atraumatic.     Right Ear: Tympanic membrane is erythematous and bulging.  Eyes:     General: No scleral icterus.    Conjunctiva/sclera: Conjunctivae normal.     Pupils: Pupils are equal, round, and reactive to light.  Neck:     Musculoskeletal: Normal range of motion and neck supple.     Thyroid: No thyromegaly.     Vascular: No JVD.     Trachea: No tracheal deviation.  Cardiovascular:     Rate and  Rhythm: Normal rate and regular rhythm.     Heart sounds: Normal heart sounds. No murmur. No friction rub. No gallop.   Pulmonary:     Effort: Pulmonary effort is normal.     Breath sounds:  Normal breath sounds.  Abdominal:     General: Bowel sounds are normal. There is no distension.     Palpations: Abdomen is soft.     Tenderness: There is no abdominal tenderness.  Musculoskeletal: Normal range of motion.  Lymphadenopathy:     Cervical: No cervical adenopathy.  Skin:    General: Skin is warm and dry.  Neurological:     Mental Status: She is alert and oriented to person, place, and time.     Cranial Nerves: No cranial nerve deficit.  Psychiatric:        Behavior: Behavior normal.        Thought Content: Thought content normal.        Judgment: Judgment normal.      UC Treatments / Results  Labs (all labs ordered are listed, but only abnormal results are displayed) Labs Reviewed - No data to display  EKG None  Radiology No results found.  Procedures Procedures (including critical care time)  Medications Ordered in UC Medications - No data to display  Initial Impression / Assessment and Plan / UC Course  I have reviewed the triage vital signs and the nursing notes.  Pertinent labs & imaging results that were available during my care of the patient were reviewed by me and considered in my medical decision making (see chart for details).     Bulging erythematous and purulent right tympanic membrane.  Prescribed amoxicillin safe for breast-feeding.  Final Clinical Impressions(s) / UC Diagnoses   Final diagnoses:  Acute suppurative otitis media of right ear without spontaneous rupture of tympanic membrane, recurrence not specified   Discharge Instructions   None    ED Prescriptions    Medication Sig Dispense Auth. Provider   amoxicillin (AMOXIL) 875 MG tablet Take 1 tablet (875 mg total) by mouth 2 (two) times daily for 10 days. 20 tablet Arnaldo Nataliamond, Jaylin Roundy S,  MD     Controlled Substance Prescriptions [place] Controlled Substance Registry consulted? Not Applicable   Arnaldo Nataliamond, Ozetta Flatley S, MD 04/12/18 1630

## 2019-11-21 IMAGING — US US MFM OB COMP +14 WKS
1 series · 14 of 28 positions shown · non-contrast
Comparison: none

[Series 1: us mfm ob comp +14 wks · 103 acquisitions, 14 frames shown]
[im 4/103]
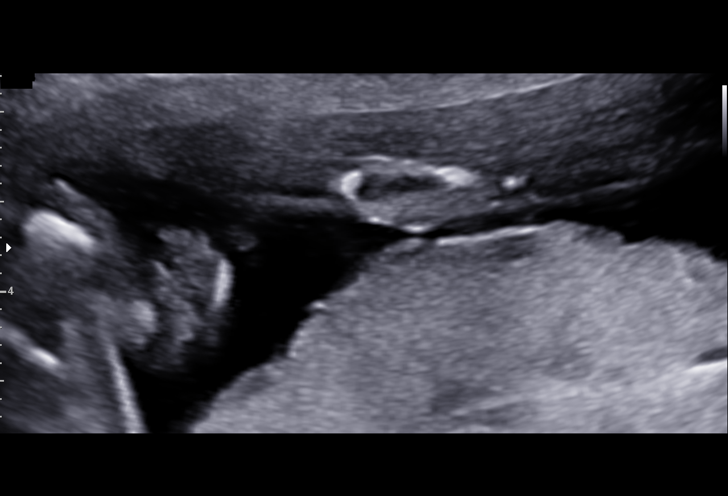
[im 12/103]
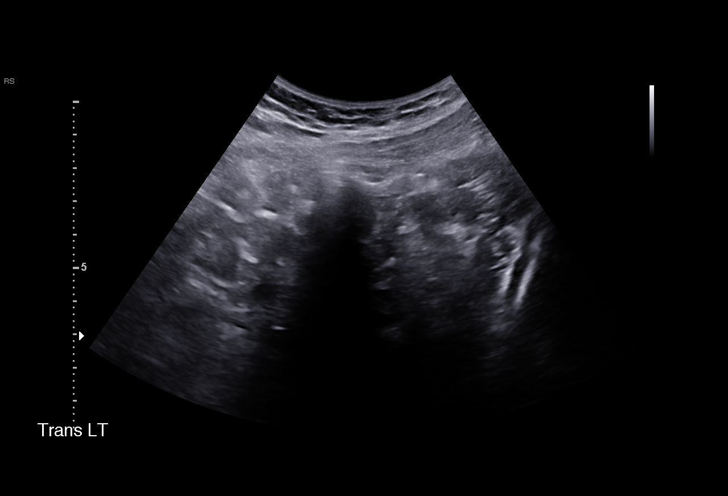
[im 19/103]
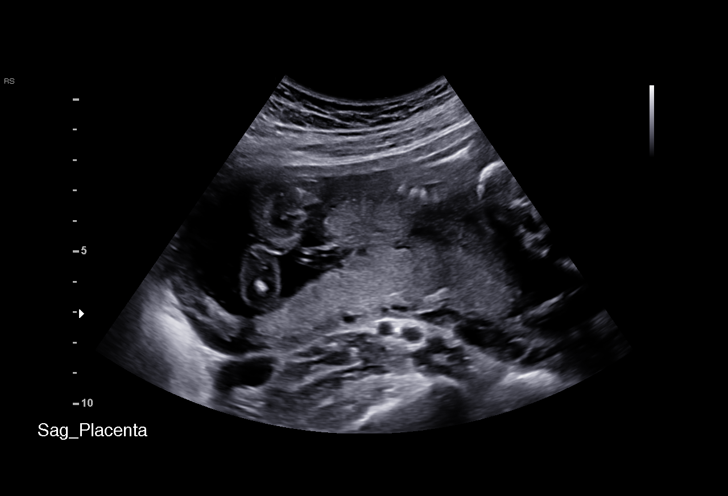
[im 27/103]
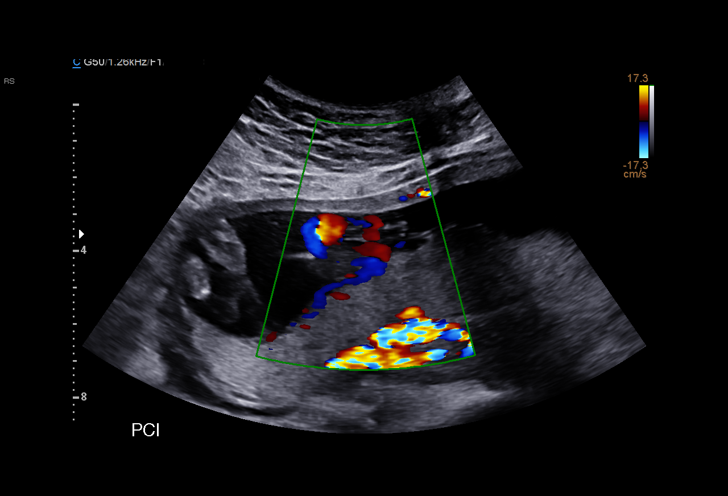
[im 35/103]
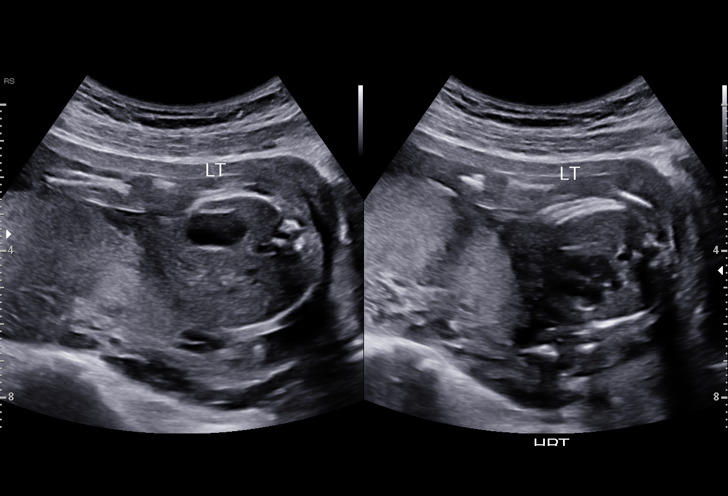
[im 42/103]
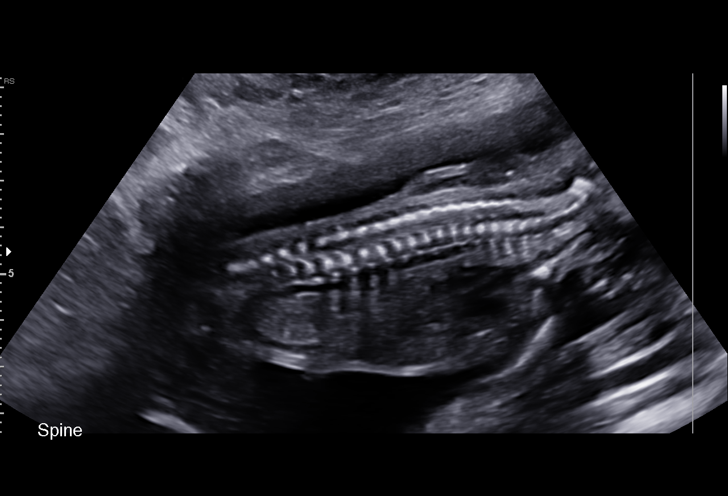
[im 50/103]
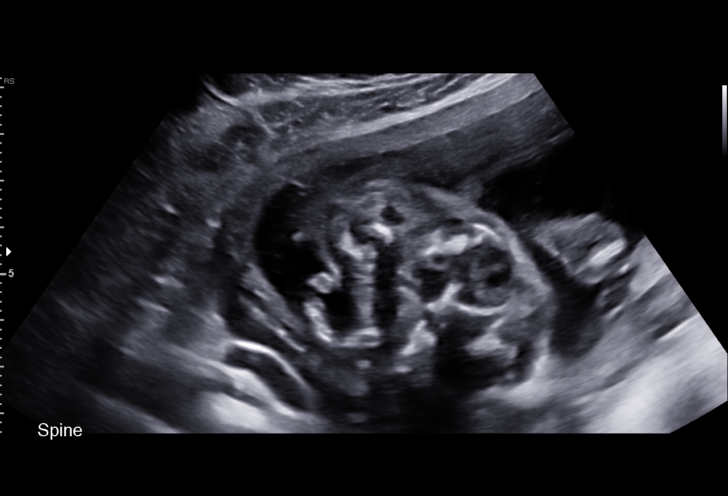
[im 57/103]
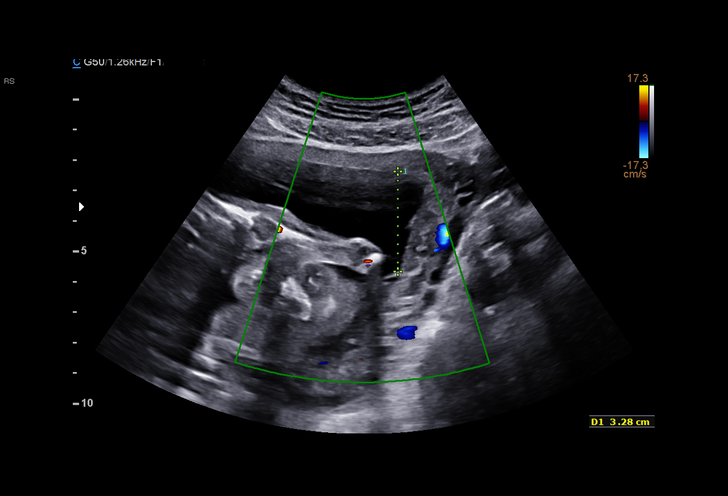
[im 65/103]
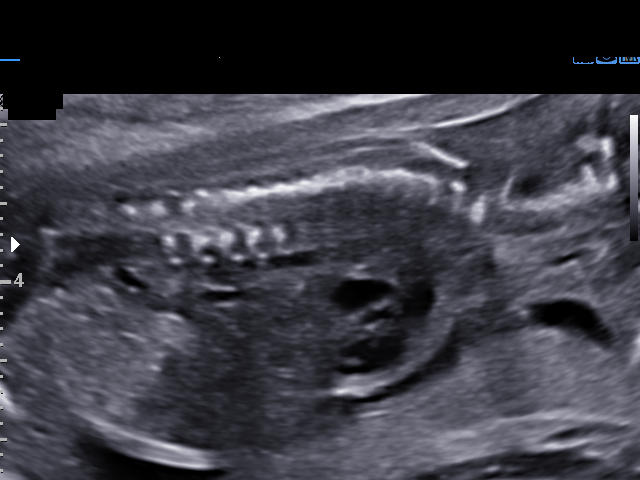
[im 72/103]
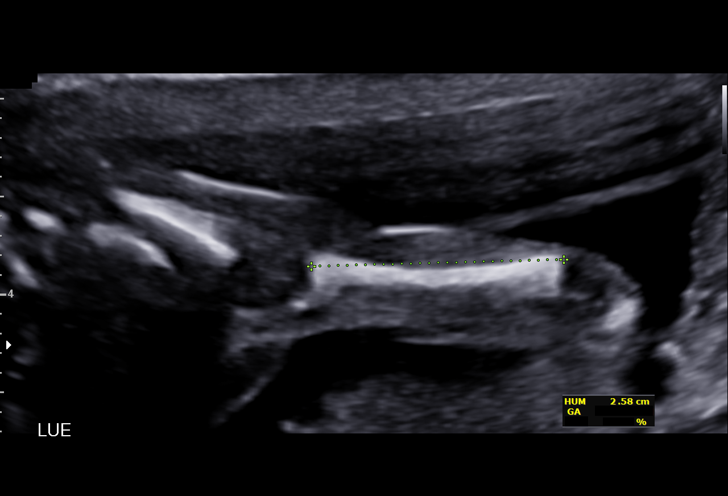
[im 80/103]
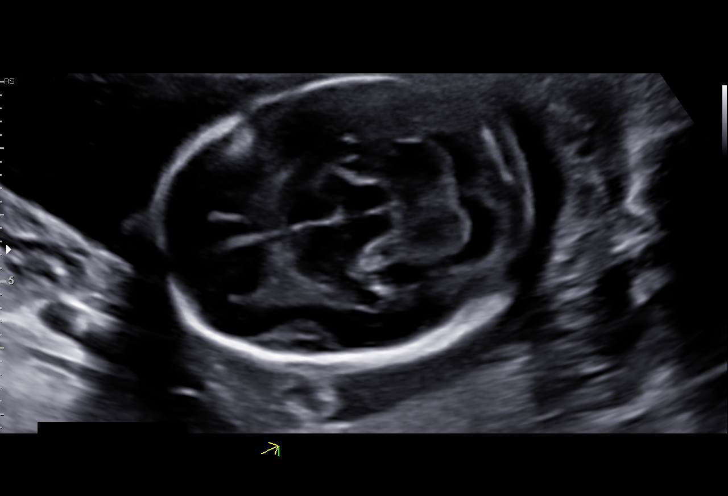
[im 87/103]
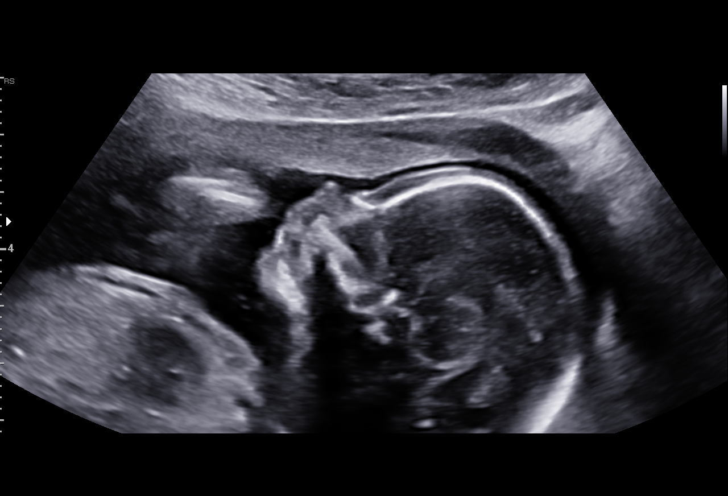
[im 95/103]
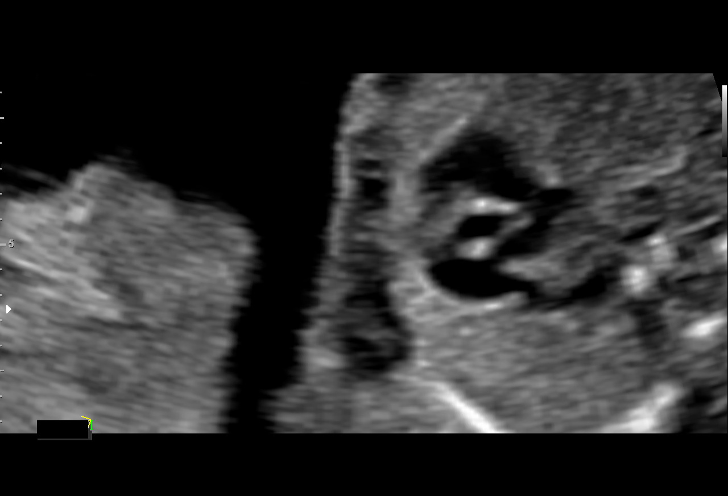
[im 103/103]
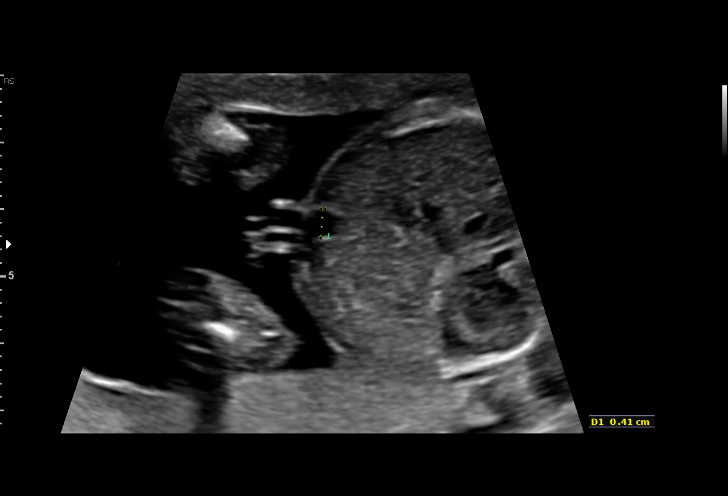

[14 of 28 positions shown; findings below may reference images not displayed]

[facility]

[facility]

1  CHRISTIAN MUNK CUBBIN         [id number]      [id number]     [id number]
Indications

18 weeks gestation of pregnancy
Encounter for antenatal screening for
malformations
OB History

Gravidity:    2         Term:   1
Living:       1
Fetal Evaluation

Num Of Fetuses:     1
Fetal Heart         146
Rate(bpm):
Cardiac Activity:   Observed
Presentation:       Cephalic
Placenta:           Posterior, above cervical os
P. Cord Insertion:  Visualized, central

Amniotic Fluid
AFI FV:      Subjectively within normal limits

Largest Pocket(cm)
3.28
Biometry
BPD:      42.7  mm     G. Age:  18w 6d         60  %    CI:        72.98   %    70 - 86
FL/HC:      16.7   %    16.1 -
HC:      158.9  mm     G. Age:  18w 6d         44  %    HC/AC:      1.19        1.09 -
AC:       133   mm     G. Age:  18w 6d         49  %    FL/BPD:     62.3   %
FL:       26.6  mm     G. Age:  18w 1d         23  %    FL/AC:      20.0   %    20 - 24
HUM:        26  mm     G. Age:  18w 1d         37  %
CER:      19.4  mm     G. Age:  18w 5d         50  %
NFT:       3.5  mm

CM:        5.7  mm

Est. FW:     243  gm      0 lb 9 oz     41  %
Gestational Age

LMP:           18w 5d        Date:  03/08/17                 EDD:   12/13/17
U/S Today:     18w 5d                                        EDD:   12/13/17
Best:          18w 5d     Det. By:  LMP  (03/08/17)          EDD:   12/13/17
Anatomy

Cranium:               Appears normal         Aortic Arch:            Appears normal
Cavum:                 Appears normal         Ductal Arch:            Appears normal
Ventricles:            Appears normal         Diaphragm:              Appears normal
Choroid Plexus:        Appears normal         Stomach:                Appears normal, left
sided
Cerebellum:            Appears normal         Abdomen:                Appears normal
Posterior Fossa:       Appears normal         Abdominal Wall:         Appears nml (cord
insert, abd wall)
Nuchal Fold:           Appears normal         Cord Vessels:           Appears normal (3
vessel cord)
Face:                  Appears normal         Kidneys:                Appear normal
(orbits and profile)
Lips:                  Appears normal         Bladder:                Appears normal
Thoracic:              Appears normal         Spine:                  Appears normal
Heart:                 Limited views,         Upper Extremities:      Appears normal
appeared normal
RVOT:                  Appears normal         Lower Extremities:      Appears normal
LVOT:                  Appears normal

Other:  Male gender. Heels and left 5th digit visualized.
Cervix Uterus Adnexa

Cervix
Length:           3.84  cm.
Normal appearance by transabdominal scan.

Uterus
No abnormality visualized.

Left Ovary
Not visualized.

Right Ovary
Not visualized.

Adnexa:       No abnormality visualized. No adnexal mass
visualized.
Impression

SIUP at 18+5 weeks
Normal detailed fetal anatomy
Markers of aneuploidy: none
Normal amniotic fluid volume
Measurements consistent with LMP dating
Recommendations

Follow-up as clinically indicated

## 2019-12-01 IMAGING — US US RENAL
1 series · 15 of 25 positions shown · non-contrast
Comparison: None.

CLINICAL DATA: Right flank pain. Hematuria. Twenty weeks pregnant.

EXAM:
RENAL / URINARY TRACT ULTRASOUND COMPLETE

[Series 1: us renal · 32 acquisitions, 15 frames shown]
[im 1/32]
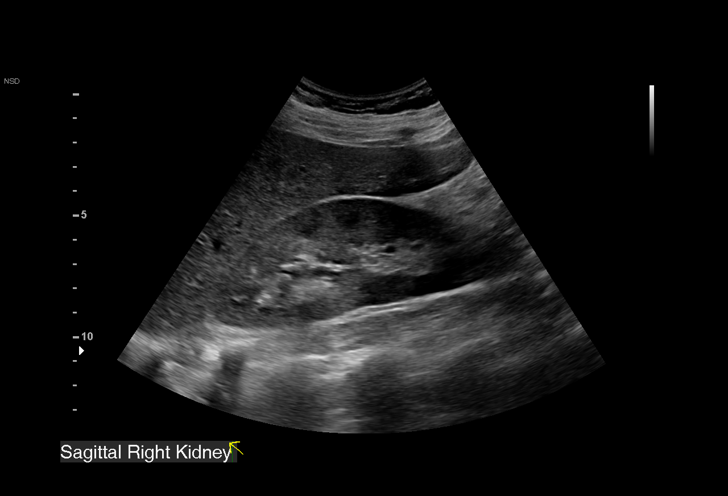
[im 3/32]
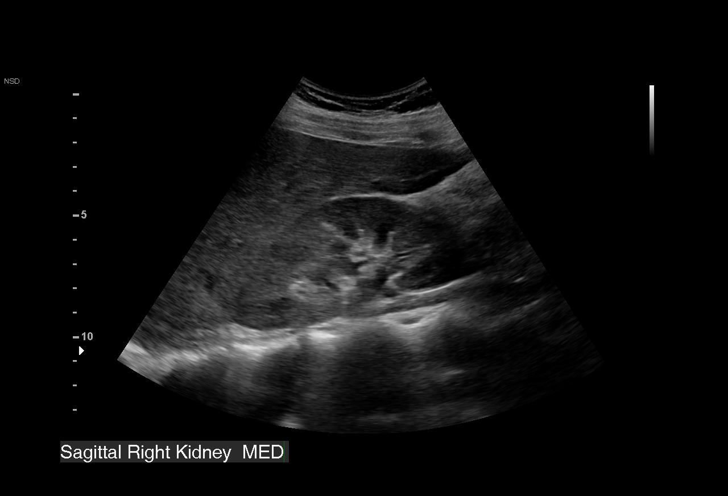
[im 6/32]
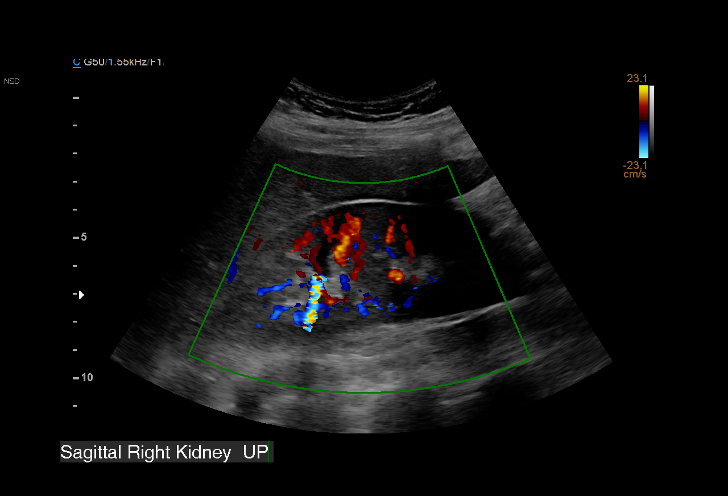
[im 7/32]
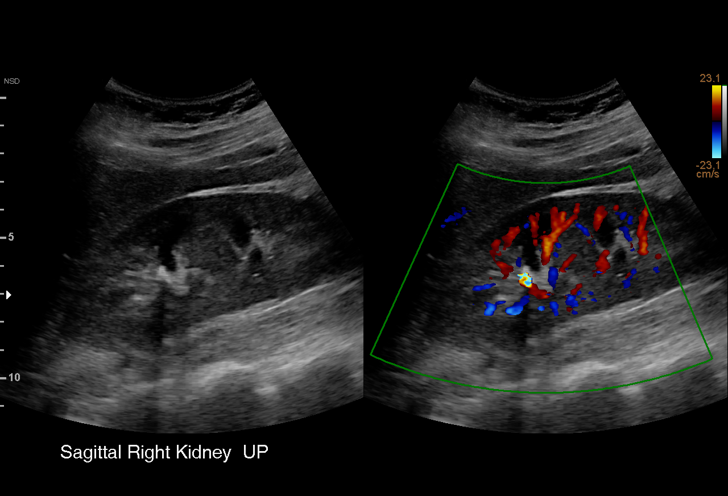
[im 10/32]
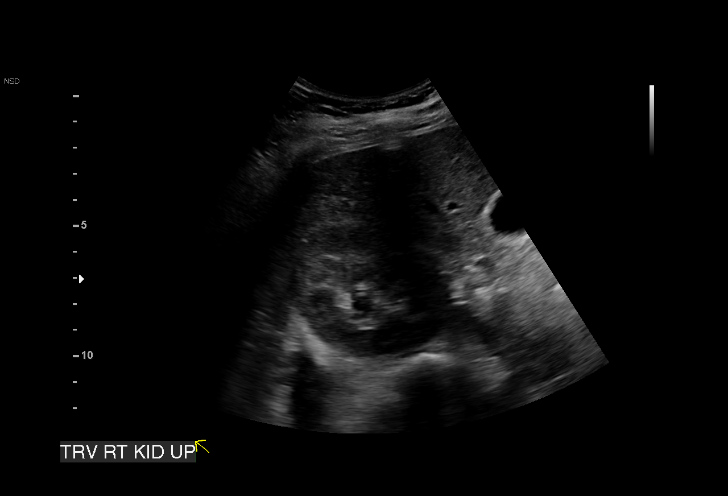
[im 12/32]
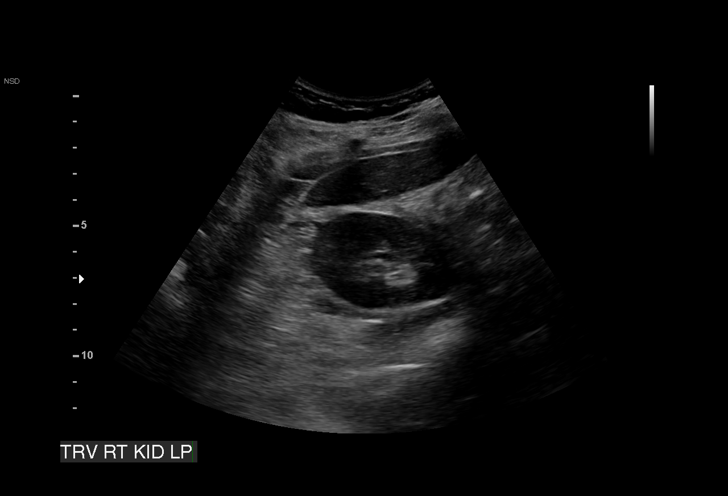
[im 13/32]
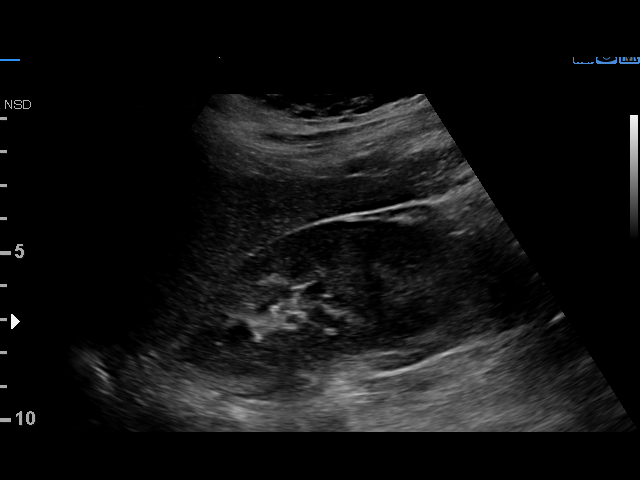
[im 16/32]
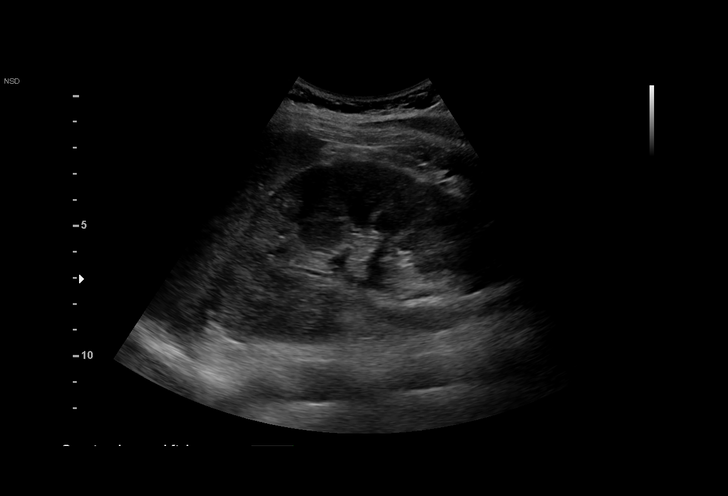
[im 19/32]
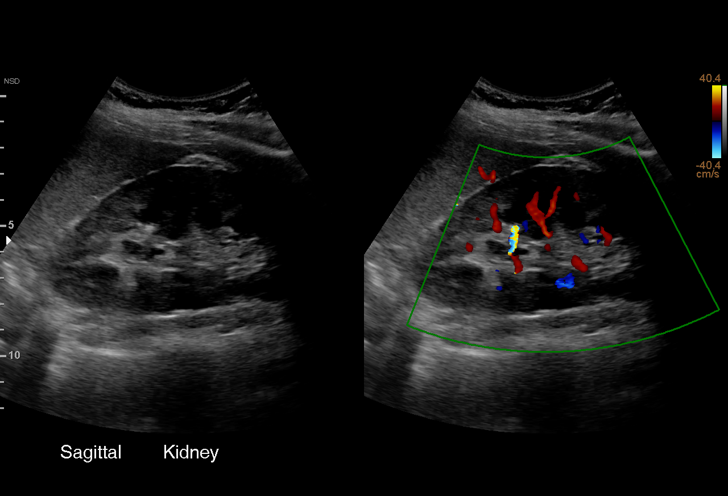
[im 20/32]
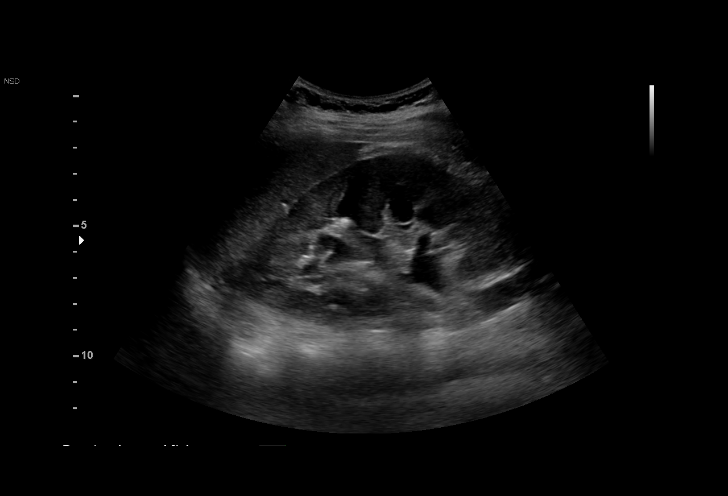
[im 22/32]
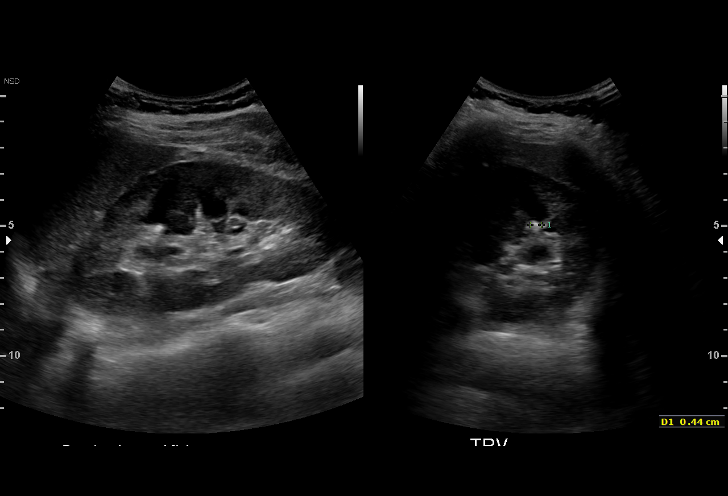
[im 25/32]
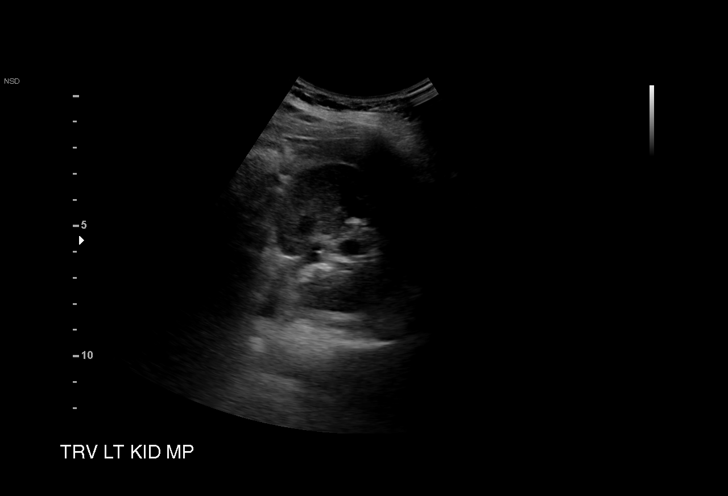
[im 26/32]
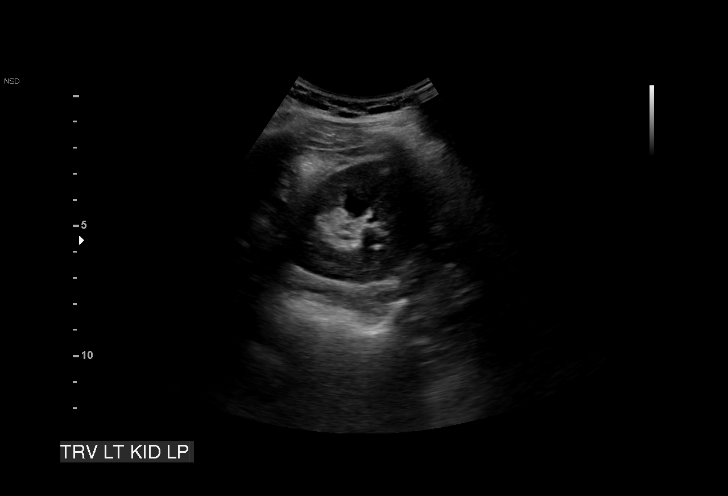
[im 29/32]
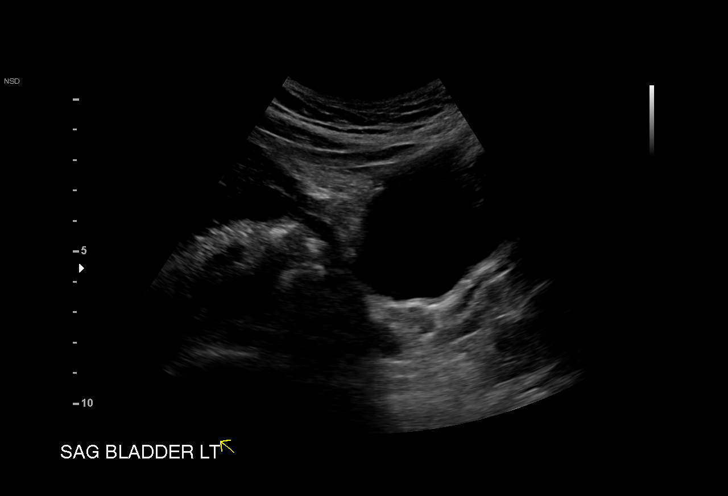
[im 32/32]
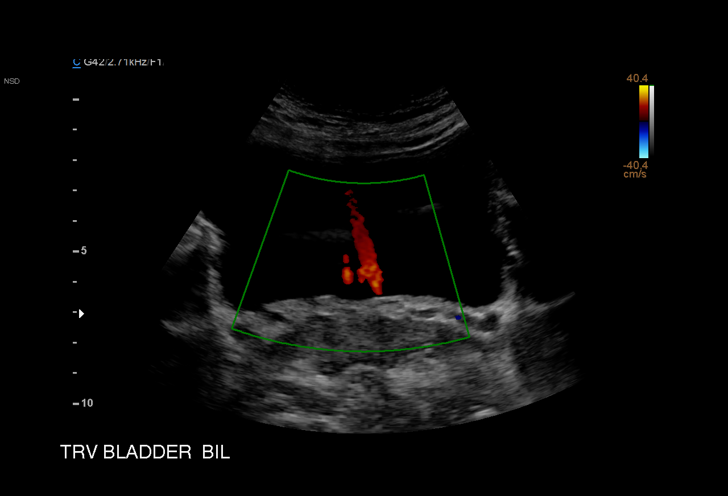

[15 of 25 positions shown; findings below may reference images not displayed]

FINDINGS: Right Kidney:

Length: 12.4. Echogenicity within normal limits. No mass or
hydronephrosis visualized. 6.4 mm shadowing nonobstructive calculus
in the upper pole of the right kidney.

Left Kidney:

Length: 11.9 cm. Echogenicity within normal limits. No mass or
hydronephrosis visualized. 4.4 mm shadowing nonobstructive calculus
in the upper pole of the left kidney.

Bladder:

Appears normal for degree of bladder distention. Bilateral ureteral
jets seen.
IMPRESSION: Bilateral nonobstructive nephrolithiasis.

No evidence of hydronephrosis.

## 2019-12-27 IMAGING — US US RENAL
2 series · 15 of 25 positions shown · non-contrast
Comparison: Renal ultrasound 07/27/2017

CLINICAL DATA: Patient with history of pyelonephritis.  Flank pain.

EXAM:
RENAL / URINARY TRACT ULTRASOUND COMPLETE

[Series 1: us renal · 13 of 53 slices shown (1 of 2)]
[im 1/53]
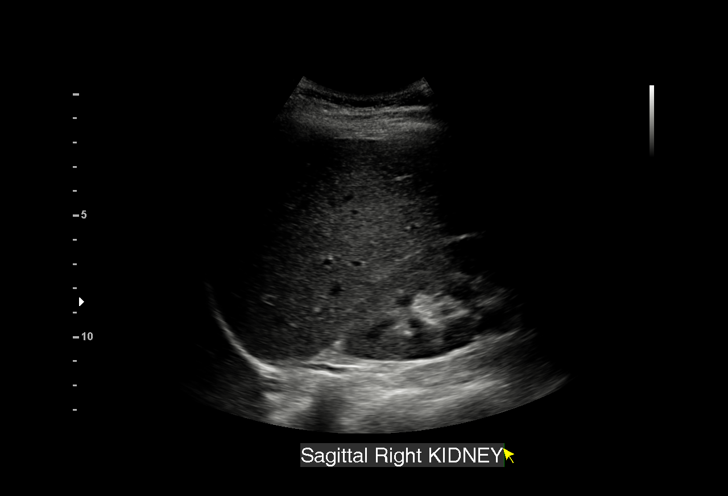
[im 5/53]
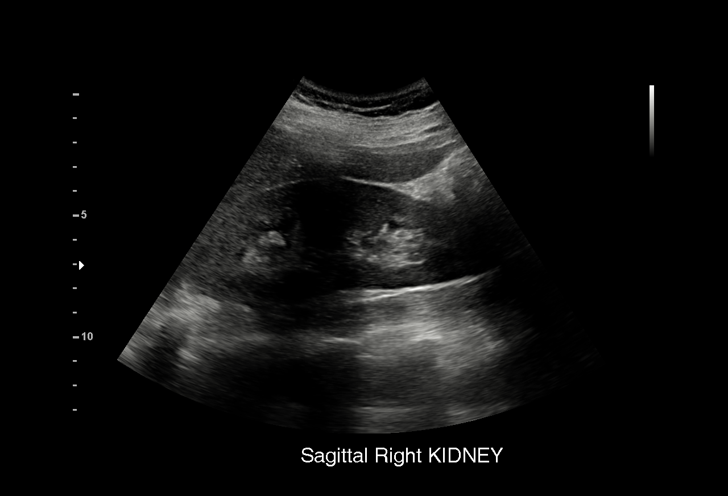
[im 10/53]
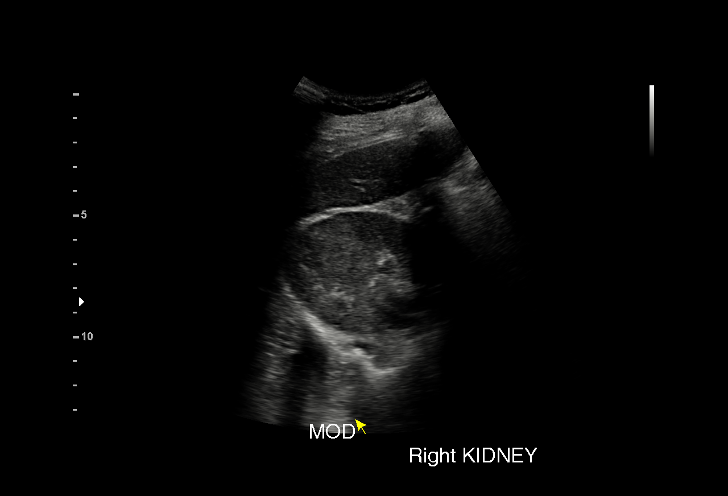
[im 13/53]
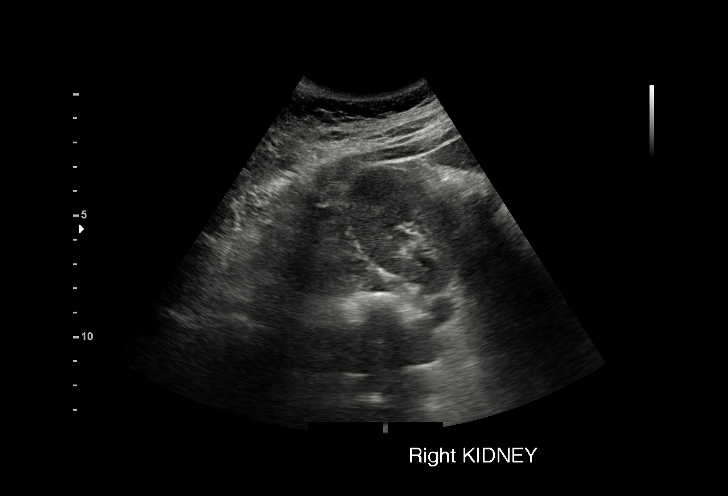
[im 18/53]
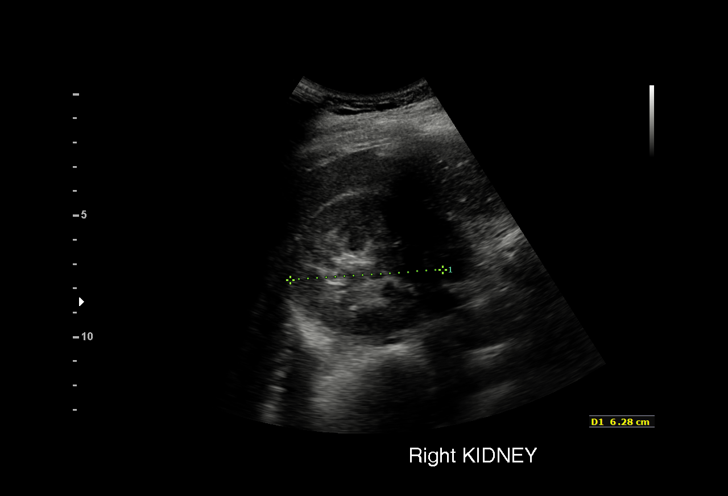
[im 23/53]
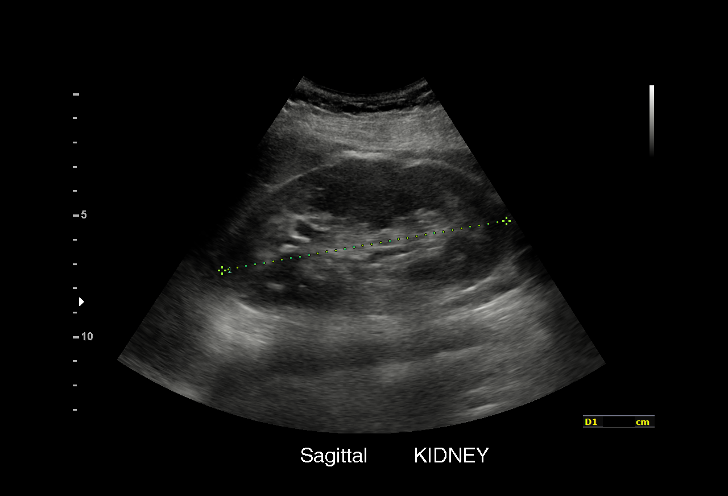
[im 25/53]
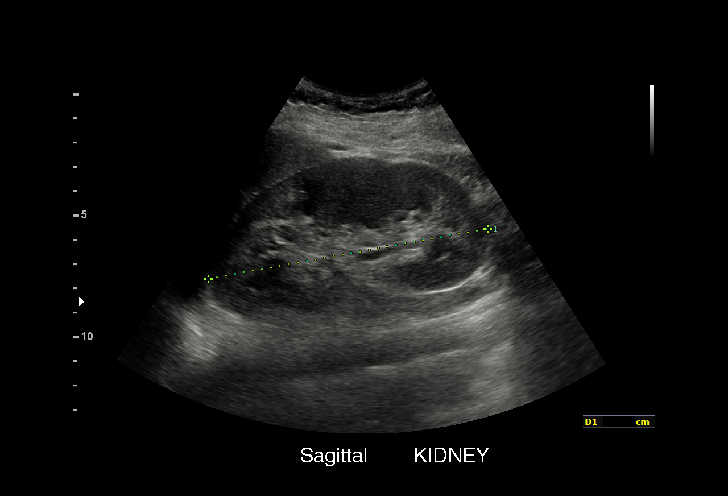
[im 30/53]
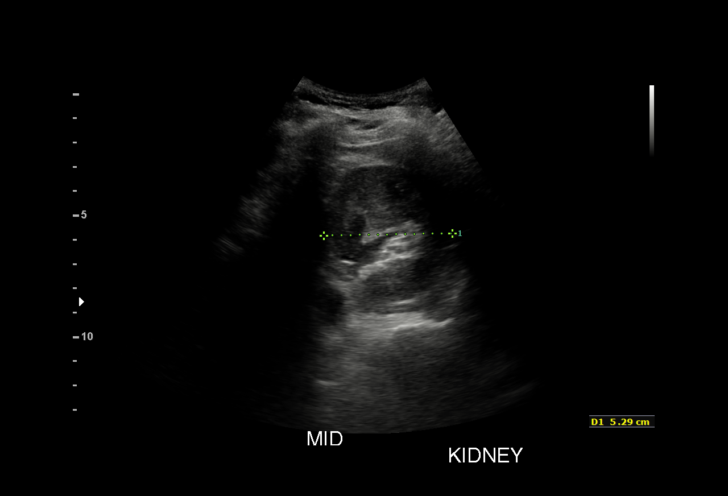
[im 35/53]
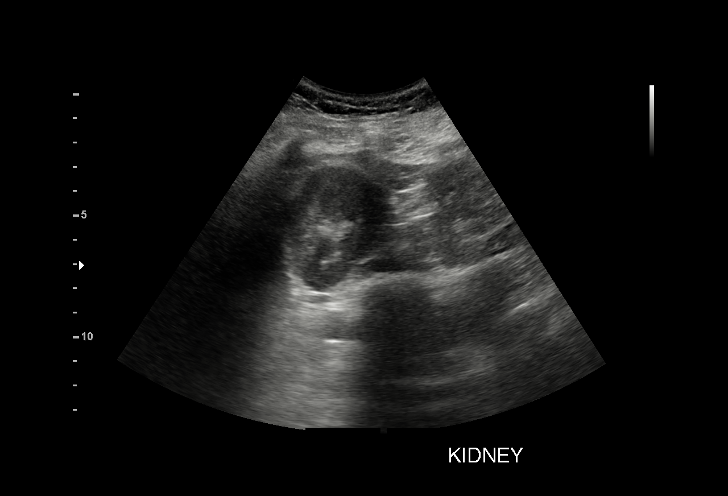
[im 38/53]
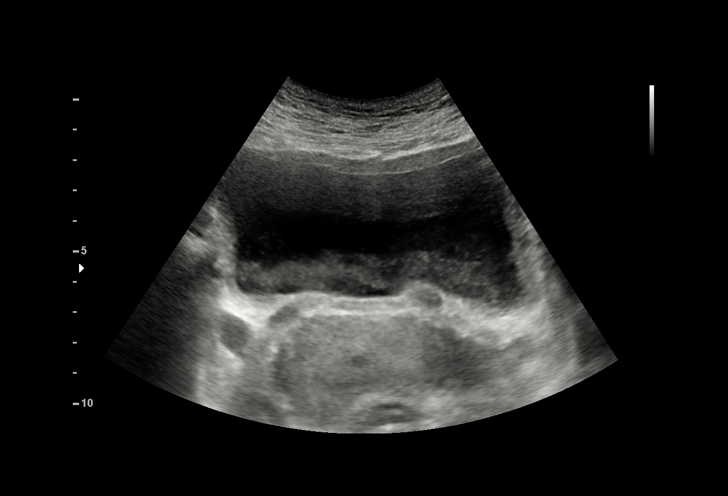
[im 43/53]
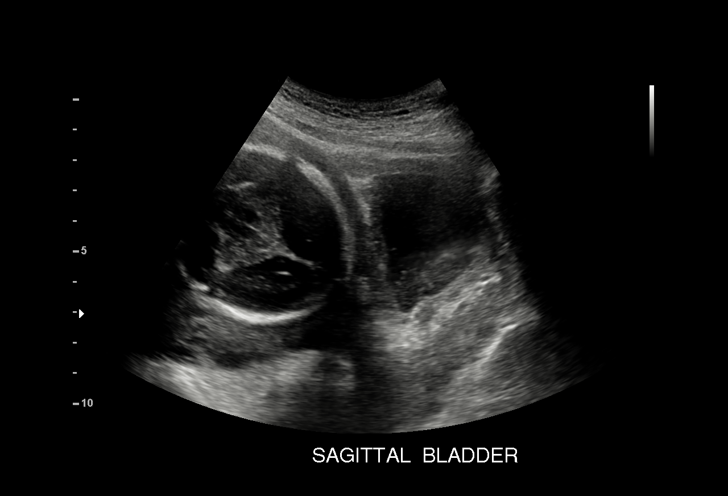
[im 48/53]
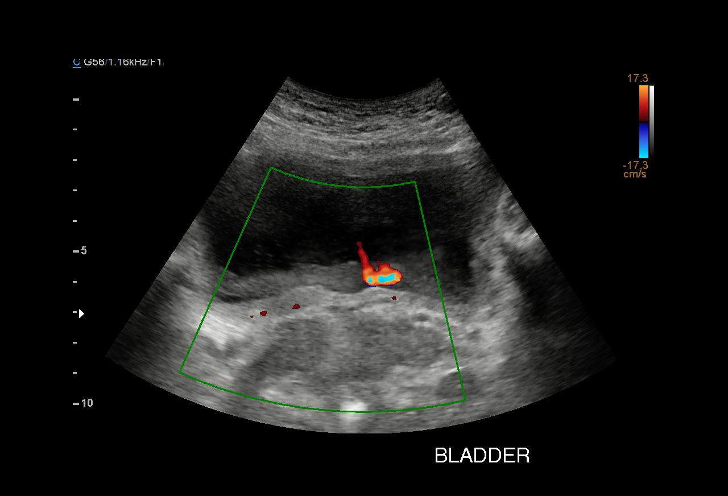
[im 50/53]
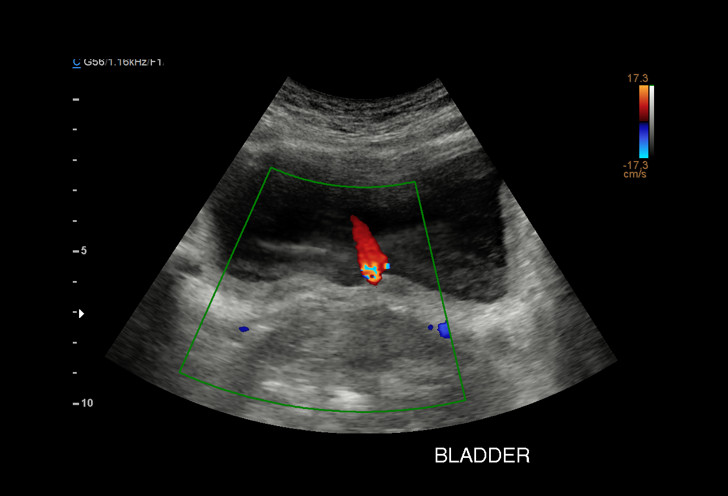

[Series 2: us renal · 2 of 8 slices shown (2 of 2)]
[im 1/8]
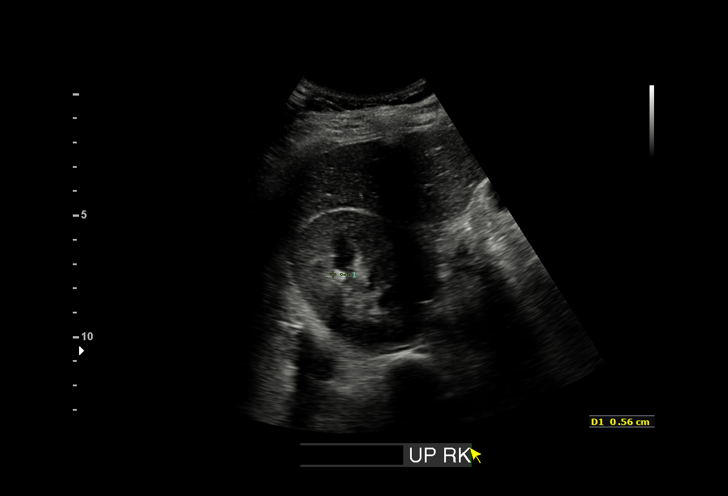
[im 8/8]
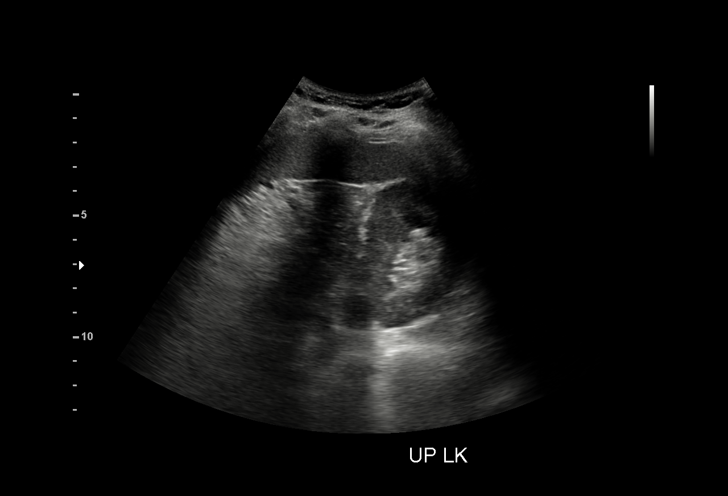

[15 of 25 positions shown; findings below may reference images not displayed]

FINDINGS: Right Kidney:

Length: 11.7 cm. Echogenicity within normal limits. No mass or
hydronephrosis visualized. There is a 4 mm shadowing echogenic stone
within the superior pole.

Left Kidney:

Length: 11.7 cm. Echogenicity within normal limits. No mass or
hydronephrosis visualized. Possible stone, poorly visualized within
the superior pole left kidney.

Bladder:

Debris within the urinary bladder.
IMPRESSION: No hydronephrosis.
